# Patient Record
Sex: Male | Born: 1970 | Race: Black or African American | Hispanic: No | Smoking: Never smoker
Health system: Southern US, Community
[De-identification: ages and names within clinical notes are randomized; demographics above are authoritative.]

## PROBLEM LIST (undated history)

## (undated) DIAGNOSIS — M109 Gout, unspecified: Secondary | ICD-10-CM

## (undated) DIAGNOSIS — M1990 Unspecified osteoarthritis, unspecified site: Secondary | ICD-10-CM

## (undated) DIAGNOSIS — I10 Essential (primary) hypertension: Secondary | ICD-10-CM

## (undated) DIAGNOSIS — I639 Cerebral infarction, unspecified: Secondary | ICD-10-CM

---

## 2019-09-12 ENCOUNTER — Encounter: Payer: Self-pay | Admitting: Emergency Medicine

## 2019-09-12 ENCOUNTER — Emergency Department: Payer: Self-pay

## 2019-09-12 ENCOUNTER — Other Ambulatory Visit: Payer: Self-pay

## 2019-09-12 ENCOUNTER — Emergency Department
Admission: EM | Admit: 2019-09-12 | Discharge: 2019-09-12 | Disposition: A | Discharge status: Home / Self Care | Payer: Self-pay | Attending: Emergency Medicine | Admitting: Emergency Medicine

## 2019-09-12 DIAGNOSIS — M79604 Pain in right leg: Secondary | ICD-10-CM | POA: Insufficient documentation

## 2019-09-12 DIAGNOSIS — R2243 Localized swelling, mass and lump, lower limb, bilateral: Secondary | ICD-10-CM | POA: Insufficient documentation

## 2019-09-12 DIAGNOSIS — I10 Essential (primary) hypertension: Secondary | ICD-10-CM | POA: Insufficient documentation

## 2019-09-12 DIAGNOSIS — M069 Rheumatoid arthritis, unspecified: Secondary | ICD-10-CM | POA: Insufficient documentation

## 2019-09-12 DIAGNOSIS — R0602 Shortness of breath: Secondary | ICD-10-CM | POA: Insufficient documentation

## 2019-09-12 HISTORY — DX: Unspecified osteoarthritis, unspecified site: M19.90

## 2019-09-12 HISTORY — DX: Cerebral infarction, unspecified: I63.9

## 2019-09-12 HISTORY — DX: Essential (primary) hypertension: I10

## 2019-09-12 HISTORY — DX: Gout, unspecified: M10.9

## 2019-09-12 LAB — CBC WITH DIFFERENTIAL/PLATELET
Abs Immature Granulocytes: 0.01 10*3/uL (ref 0.00–0.07)
Basophils Absolute: 0 10*3/uL (ref 0.0–0.1)
Basophils Relative: 1 %
Eosinophils Absolute: 0.3 10*3/uL (ref 0.0–0.5)
Eosinophils Relative: 4 %
HCT: 42.1 % (ref 39.0–52.0)
Hemoglobin: 13.9 g/dL (ref 13.0–17.0)
Immature Granulocytes: 0 %
Lymphocytes Relative: 20 %
Lymphs Abs: 1.6 10*3/uL (ref 0.7–4.0)
MCH: 28.1 pg (ref 26.0–34.0)
MCHC: 33 g/dL (ref 30.0–36.0)
MCV: 85.1 fL (ref 80.0–100.0)
Monocytes Absolute: 0.9 10*3/uL (ref 0.1–1.0)
Monocytes Relative: 12 %
Neutro Abs: 5.1 10*3/uL (ref 1.7–7.7)
Neutrophils Relative %: 63 %
Platelets: 250 10*3/uL (ref 150–400)
RBC: 4.95 MIL/uL (ref 4.22–5.81)
RDW: 13.6 % (ref 11.5–15.5)
WBC: 8 10*3/uL (ref 4.0–10.5)
nRBC: 0 % (ref 0.0–0.2)

## 2019-09-12 LAB — BASIC METABOLIC PANEL WITH GFR
Anion gap: 9 (ref 5–15)
BUN: 8 mg/dL (ref 6–20)
CO2: 27 mmol/L (ref 22–32)
Calcium: 8.8 mg/dL — ABNORMAL LOW (ref 8.9–10.3)
Chloride: 102 mmol/L (ref 98–111)
Creatinine, Ser: 0.85 mg/dL (ref 0.61–1.24)
GFR calc Af Amer: >60 mL/min
GFR calc non Af Amer: >60 mL/min
Glucose, Bld: 115 mg/dL — ABNORMAL HIGH (ref 70–99)
Potassium: 3.8 mmol/L (ref 3.5–5.1)
Sodium: 138 mmol/L (ref 135–145)

## 2019-09-12 LAB — BRAIN NATRIURETIC PEPTIDE: B Natriuretic Peptide: 26 pg/mL (ref 0.0–100.0)

## 2019-09-12 MED ORDER — KETOROLAC TROMETHAMINE 30 MG/ML IJ SOLN
30.0000 mg | Freq: Once | INTRAMUSCULAR | Status: AC
  Administered 2019-09-12: 12:00:00 30 mg via INTRAMUSCULAR
  Filled 2019-09-12: qty 1

## 2019-09-12 MED ORDER — IBUPROFEN 600 MG PO TABS: 600.0000 mg | ORAL_TABLET | Freq: Four times a day (QID) | ORAL | 0 refills | Status: DC | PRN

## 2019-09-12 MED ORDER — ACETAMINOPHEN 500 MG PO TABS
1000.0000 mg | ORAL_TABLET | Freq: Once | ORAL | Status: AC
  Administered 2019-09-12: 12:00:00 1000 mg via ORAL
  Filled 2019-09-12: qty 2

## 2019-09-12 NOTE — Discharge Instructions (Signed)
You need to follow-up with rheumatology.  Please call the above number to get a follow-up appointment scheduled.  Take the ibuprofen for the next 2 weeks.  Take food with it.  Stop taking it if you develop blood in your stool or black stool.  Return to the ER for fevers, redness of the joint or any other concerns

## 2019-09-12 NOTE — ED Provider Notes (Signed)
[facility] Emergency Department Provider Note  ____________________________________________   First MD Initiated Contact with Patient 09/12/19 1152     (approximate)  I have reviewed the triage vital signs and the nursing notes.   HISTORY  Chief Complaint Joint Pain    HPI Patrick Carter is a 49 y.o. male with history of arthritis, gout, hypertension, stroke who presents with joint pain.  Patient states that he has been having joint pain all over for the past 2 months since he has moved here.  Patient states that his pain is mostly in his joints, does have some associated swelling with, does not take anything to help with the pain, worse with moving, better at rest.  On review of patient's medications he is on Leflunomide.  He does report being seen by rheumatology before moved.  Patient previously lived in [place] he states he has still been taking this medicine.  Denies any fevers, warmth of the joints.  He does report leg swelling intermittently to the point where the right leg will swell up and he will have some pain throughout the right leg.  Denies a history of blood clots.          Past Medical History:  Diagnosis Date  . Arthritis   . Gout   . Hypertension   . Stroke [facility])     There are no problems to display for this patient.   History reviewed. No pertinent surgical history.  Prior to Admission medications   Not on File    Allergies Patient has no known allergies.  No family history on file.  Social History Social History   Tobacco Use  . Smoking status: Never Smoker  . Smokeless tobacco: Never Used  Substance Use Topics  . Alcohol use: Not Currently  . Drug use: Not Currently      Review of Systems Constitutional: No fever/chills Eyes: No visual changes. ENT: No sore throat. Cardiovascular: Denies chest pain. Respiratory: Denies shortness of breath. Gastrointestinal: No abdominal pain.  No nausea, no  vomiting.  No diarrhea.  No constipation. Genitourinary: Negative for dysuria. Musculoskeletal: Negative for back pain.  Joint pain, leg swelling Skin: Negative for rash. Neurological: Negative for headaches, focal weakness or numbness. All other ROS negative ____________________________________________   PHYSICAL EXAM:  VITAL SIGNS: ED Triage Vitals  Enc Vitals Group     BP 09/12/19 1032 (!) 143/101     Pulse Rate 09/12/19 1032 85     Resp 09/12/19 1032 16     Temp 09/12/19 1032 98.8 F (37.1 C)     Temp Source 09/12/19 1032 Oral     SpO2 09/12/19 1032 100 %     Weight 09/12/19 1030 156 lb (70.8 kg)     Height 09/12/19 1030 5\' 5"  (1.651 m)     Head Circumference --      Peak Flow --      Pain Score 09/12/19 1029 10     Pain Loc --      Pain Edu? --      Excl. in GC? --     Constitutional: Alert and oriented. Well appearing and in no acute distress. Eyes: Conjunctivae are normal. EOMI. Head: Atraumatic. Nose: No congestion/rhinnorhea. Mouth/Throat: Mucous membranes are moist.   Neck: No stridor. Trachea Midline. FROM Cardiovascular: Normal rate, regular rhythm. Grossly normal heart sounds.  Good peripheral circulation. Respiratory: Normal respiratory effort.  No retractions. Lungs CTAB. Gastrointestinal: Soft and nontender. No distention. No abdominal bruits.  Musculoskeletal: Maybe a little bit of increased swelling mostly in the right knee maybe little bit in the ankle.  No warmth or redness on the joints good distal pulses Neurologic:  Normal speech and language. No gross focal neurologic deficits are appreciated.  Skin:  Skin is warm, dry and intact. No rash noted. Psychiatric: Mood and affect are normal. Speech and behavior are normal. GU: Deferred   ____________________________________________   LABS (all labs ordered are listed, but only abnormal results are displayed)  Labs Reviewed  BASIC METABOLIC PANEL - Abnormal; Notable for the following components:       Result Value   Glucose, Bld 115 (*)    Calcium 8.8 (*)    All other components within normal limits  CBC WITH DIFFERENTIAL/PLATELET  BRAIN NATRIURETIC PEPTIDE   ____________________________________________   RADIOLOGY   Official radiology report(s): US Venous Img Lower Bilateral  Result Date: 09/12/2019 CLINICAL DATA:  Bilateral leg pain and swelling for 2 weeks EXAM: BILATERAL LOWER EXTREMITY VENOUS DOPPLER ULTRASOUND TECHNIQUE: Gray-scale sonography with graded compression, as well as color Doppler and duplex ultrasound were performed to evaluate the lower extremity deep venous systems from the level of the common femoral vein and including the common femoral, femoral, profunda femoral, popliteal and calf veins including the posterior tibial, peroneal and gastrocnemius veins when visible. The superficial great saphenous vein was also interrogated. Spectral Doppler was utilized to evaluate flow at rest and with distal augmentation maneuvers in the common femoral, femoral and popliteal veins. COMPARISON:  None. FINDINGS: RIGHT LOWER EXTREMITY Common Femoral Vein: No evidence of thrombus. Normal compressibility, respiratory phasicity and response to augmentation. Saphenofemoral Junction: No evidence of thrombus. Normal compressibility and flow on color Doppler imaging. Profunda Femoral Vein: No evidence of thrombus. Normal compressibility and flow on color Doppler imaging. Femoral Vein: No evidence of thrombus. Normal compressibility, respiratory phasicity and response to augmentation. Popliteal Vein: No evidence of thrombus. Normal compressibility, respiratory phasicity and response to augmentation. Calf Veins: No evidence of thrombus. Normal compressibility and flow on color Doppler imaging. LEFT LOWER EXTREMITY Common Femoral Vein: No evidence of thrombus. Normal compressibility, respiratory phasicity and response to augmentation. Saphenofemoral Junction: No evidence of thrombus. Normal  compressibility and flow on color Doppler imaging. Profunda Femoral Vein: No evidence of thrombus. Normal compressibility and flow on color Doppler imaging. Femoral Vein: No evidence of thrombus. Normal compressibility, respiratory phasicity and response to augmentation. Popliteal Vein: No evidence of thrombus. Normal compressibility, respiratory phasicity and response to augmentation. Calf Veins: No evidence of thrombus. Normal compressibility and flow on color Doppler imaging. IMPRESSION: No evidence of deep venous thrombosis in either lower extremity. Electronically Signed   By: Judie Petit.  Shick M.D.   On: 09/12/2019 13:10    ____________________________________________   PROCEDURES  Procedure(s) performed (including Critical Care):  Procedures   ____________________________________________   INITIAL IMPRESSION / ASSESSMENT AND PLAN / ED COURSE  EADEN HETTINGER was evaluated in Emergency Department on 09/12/2019 for the symptoms described in the history of present illness. He was evaluated in the context of the global COVID-19 pandemic, which necessitated consideration that the patient might be at risk for infection with the SARS-CoV-2 virus that causes COVID-19. Institutional protocols and algorithms that pertain to the evaluation of patients at risk for COVID-19 are in a state of rapid change based on information released by regulatory bodies including the CDC and federal and state organizations. These policies and algorithms were followed during the patient's care in the ED.    Patient  comes in with pain all over but it sounds like is more likely located in his joints.  I suspect that this is secondary to his known rheumatoid arthritis.  However given patient is having some swelling in the right knee and a little in the right ankle will get ultrasound to make sure no evidence of DVT.  No evidence of septic joints on exam.  Will check basic labs to evaluate for electrolyte abnormalities, AKI,  heart failure.  No redness or warmth to suggest gout or pseudogout.  Patient noted to be hypertensive in triage.  However there is no evidence of endorgan damage given his kidney function is normal and he is not having any other symptoms. No white count to suggest infection.  No evidence of anemia  Patient feeling better after Tylenol and Toradol.  Ultrasounds are negative.  Discussed with patient that his joint pain is most likely from his rheumatoid arthritis and get started on some high-dose ibuprofen.  Will patient follow-up with rheumatology for further discussion given this is been going on for 2 months.   I discussed the provisional nature of ED diagnosis, the treatment so far, the ongoing plan of care, follow up appointments and return precautions with the patient and any family or support people present. They expressed understanding and agreed with the plan, discharged home.         ____________________________________________   FINAL CLINICAL IMPRESSION(S) / ED DIAGNOSES   Final diagnoses:  Rheumatoid arthritis, involving unspecified site, unspecified whether rheumatoid factor present ([place])      MEDICATIONS GIVEN DURING THIS VISIT:  Medications  acetaminophen (TYLENOL) tablet 1,000 mg (1,000 mg Oral Given 09/12/19 1219)  ketorolac (TORADOL) 30 MG/ML injection 30 mg (30 mg Intramuscular Given 09/12/19 1219)     ED Discharge Orders         Ordered    ibuprofen (ADVIL) 600 MG tablet  Every 6 hours PRN     09/12/19 1416           Note:  This document was prepared using Dragon voice recognition software and may include unintentional dictation errors.   Vanessa Dent, MD 09/12/19 [phone]

## 2019-09-12 NOTE — ED Notes (Signed)
US at bedside

## 2019-09-12 NOTE — ED Triage Notes (Signed)
Pt to ED via POV c/o joint pain. Pt states that he has been having pain all over since about 2 months ago when he first moved here. Pt states that he has noticed that he has swelling in his ankles at time. Pt states that ankles are currently swollen. Pt is in NAD.

## 2020-08-19 ENCOUNTER — Encounter: Payer: Self-pay | Admitting: Emergency Medicine

## 2020-08-19 ENCOUNTER — Emergency Department
Admission: EM | Admit: 2020-08-19 | Discharge: 2020-08-19 | Disposition: A | Discharge status: Home / Self Care | Payer: Self-pay | Attending: Emergency Medicine | Admitting: Emergency Medicine

## 2020-08-19 ENCOUNTER — Other Ambulatory Visit: Payer: Self-pay

## 2020-08-19 ENCOUNTER — Emergency Department: Payer: Self-pay

## 2020-08-19 DIAGNOSIS — I10 Essential (primary) hypertension: Secondary | ICD-10-CM | POA: Insufficient documentation

## 2020-08-19 DIAGNOSIS — R1011 Right upper quadrant pain: Secondary | ICD-10-CM | POA: Insufficient documentation

## 2020-08-19 DIAGNOSIS — M069 Rheumatoid arthritis, unspecified: Secondary | ICD-10-CM | POA: Insufficient documentation

## 2020-08-19 DIAGNOSIS — R1010 Upper abdominal pain, unspecified: Secondary | ICD-10-CM

## 2020-08-19 DIAGNOSIS — R1012 Left upper quadrant pain: Secondary | ICD-10-CM | POA: Insufficient documentation

## 2020-08-19 LAB — COMPREHENSIVE METABOLIC PANEL
ALT: 16 U/L (ref 0–44)
AST: 23 U/L (ref 15–41)
Albumin: 4.1 g/dL (ref 3.5–5.0)
Alkaline Phosphatase: 75 U/L (ref 38–126)
Anion gap: 9 (ref 5–15)
BUN: 13 mg/dL (ref 6–20)
CO2: 24 mmol/L (ref 22–32)
Calcium: 9 mg/dL (ref 8.9–10.3)
Chloride: 101 mmol/L (ref 98–111)
Creatinine, Ser: 0.9 mg/dL (ref 0.61–1.24)
GFR, Estimated: >60 mL/min (ref >60)
Glucose, Bld: 100 mg/dL — ABNORMAL HIGH (ref 70–99)
Potassium: 4.2 mmol/L (ref 3.5–5.1)
Sodium: 134 mmol/L — ABNORMAL LOW (ref 135–145)
Total Bilirubin: 0.8 mg/dL (ref 0.3–1.2)
Total Protein: 7.7 g/dL (ref 6.5–8.1)

## 2020-08-19 LAB — CBC
HCT: 43 % (ref 39.0–52.0)
Hemoglobin: 14.1 g/dL (ref 13.0–17.0)
MCH: 27.6 pg (ref 26.0–34.0)
MCHC: 32.8 g/dL (ref 30.0–36.0)
MCV: 84.1 fL (ref 80.0–100.0)
Platelets: 239 10*3/uL (ref 150–400)
RBC: 5.11 MIL/uL (ref 4.22–5.81)
RDW: 13.4 % (ref 11.5–15.5)
WBC: 8.1 10*3/uL (ref 4.0–10.5)
nRBC: 0 % (ref 0.0–0.2)

## 2020-08-19 LAB — URINALYSIS, COMPLETE (UACMP) WITH MICROSCOPIC
Bacteria, UA: NONE SEEN
Bilirubin Urine: NEGATIVE
Glucose, UA: NEGATIVE mg/dL
Hgb urine dipstick: NEGATIVE
Ketones, ur: NEGATIVE mg/dL
Leukocytes,Ua: NEGATIVE
Nitrite: NEGATIVE
Protein, ur: NEGATIVE mg/dL
Specific Gravity, Urine: 1.003 — ABNORMAL LOW (ref 1.005–1.030)
Squamous Epithelial / HPF: NONE SEEN (ref 0–5)
pH: 6 (ref 5.0–8.0)

## 2020-08-19 LAB — LIPASE, BLOOD: Lipase: 39 U/L (ref 11–51)

## 2020-08-19 MED ORDER — IOHEXOL 300 MG/ML  SOLN
100.0000 mL | Freq: Once | INTRAMUSCULAR | Status: AC | PRN
  Administered 2020-08-19: 100 mL via INTRAVENOUS
  Filled 2020-08-19: qty 100

## 2020-08-19 MED ORDER — KETOROLAC TROMETHAMINE 30 MG/ML IJ SOLN
30.0000 mg | Freq: Once | INTRAMUSCULAR | Status: AC
  Administered 2020-08-19: 30 mg via INTRAVENOUS
  Filled 2020-08-19: qty 1

## 2020-08-19 MED ORDER — ONDANSETRON 4 MG PO TBDP: 4.0000 mg | ORAL_TABLET | Freq: Three times a day (TID) | ORAL | 0 refills | Status: DC | PRN

## 2020-08-19 MED ORDER — SODIUM CHLORIDE 0.9 % IV BOLUS
1000.0000 mL | Freq: Once | INTRAVENOUS | Status: AC
  Administered 2020-08-19: 1000 mL via INTRAVENOUS

## 2020-08-19 MED ORDER — KETOROLAC TROMETHAMINE 10 MG PO TABS: 10.0000 mg | ORAL_TABLET | Freq: Four times a day (QID) | ORAL | 0 refills | Status: DC | PRN

## 2020-08-19 NOTE — ED Triage Notes (Signed)
Pt to ED via POV for generalized abdominal pain and nausea since waking up this morning. Pt denies vomiting. Diarrhea, or fever. Pt states that he has had some chills. Pt is in NAD.

## 2020-08-19 NOTE — ED Notes (Signed)
See triage note  Presents with body aches ,nausea and chills   States sxs' started about 1 month ago  But feels like body aches are worse today  Unsure of fever  But has had chills at home  Afebrile on arrival

## 2020-08-19 NOTE — ED Provider Notes (Signed)
[facility] Emergency Department Provider Note  ____________________________________________   Event Date/Time   First MD Initiated Contact with Patient 08/19/20 1045     (approximate)  I have reviewed the triage vital signs and the nursing notes.   HISTORY  Chief Complaint Abdominal Pain and Nausea    HPI Patrick Carter is a 50 y.o. male [facility] emergency department with diffuse abdominal pain.  Patient states he has body aches regularly due to rheumatoid arthritis.  States that the abdominal pain started few days ago and now he feels nauseated every time he tries to eat.  No actual vomiting or diarrhea.  Just abdominal pain.  No fever or chills.  No recent antibiotics, camping, bad food etc.    Past Medical History:  Diagnosis Date  . Arthritis   . Gout   . Hypertension   . Stroke [facility])     There are no problems to display for this patient.   History reviewed. No pertinent surgical history.  Prior to Admission medications   Medication Sig Start Date End Date Taking? Authorizing Provider  ketorolac (TORADOL) 10 MG tablet Take 1 tablet (10 mg total) by mouth every 6 (six) hours as needed. 08/19/20  Yes Fisher, Roselyn Bering, PA-C  ondansetron (ZOFRAN-ODT) 4 MG disintegrating tablet Take 1 tablet (4 mg total) by mouth every 8 (eight) hours as needed. 08/19/20  Yes Faythe Ghee, PA-C    Allergies Patient has no known allergies.  No family history on file.  Social History Social History   Tobacco Use  . Smoking status: Never Smoker  . Smokeless tobacco: Never Used  Substance Use Topics  . Alcohol use: Not Currently  . Drug use: Not Currently    Review of Systems  Constitutional: No fever/chills Eyes: No visual changes. ENT: No sore throat. Respiratory: Denies cough Cardiovascular: Denies chest pain Gastrointestinal: Positive abdominal pain Genitourinary: Negative for dysuria. Musculoskeletal: Negative for back pain. Skin: Negative  for rash. Psychiatric: no mood changes,     ____________________________________________   PHYSICAL EXAM:  VITAL SIGNS: ED Triage Vitals  Enc Vitals Group     BP 08/19/20 1030 (!) 159/114     Pulse Rate 08/19/20 1030 77     Resp 08/19/20 1030 15     Temp 08/19/20 1030 98.4 F (36.9 C)     Temp Source 08/19/20 1030 Oral     SpO2 08/19/20 1030 96 %     Weight 08/19/20 1030 160 lb (72.6 kg)     Height 08/19/20 1030 5\' 5"  (1.651 m)     Head Circumference --      Peak Flow --      Pain Score 08/19/20 1033 8     Pain Loc --      Pain Edu? --      Excl. in GC? --     Constitutional: Alert and oriented. Well appearing and in no acute distress. Eyes: Conjunctivae are normal.  Head: Atraumatic. Nose: No congestion/rhinnorhea. Mouth/Throat: Mucous membranes are moist.   Neck:  supple no lymphadenopathy noted Cardiovascular: Normal rate, regular rhythm. Heart sounds are normal Respiratory: Normal respiratory effort.  No retractions, lungs c t a  Abd: soft tender right upper quadrant, left upper quadrant, and left lower quadrant, right lower quadrant is minimally tender , bs normal all 4 quad GU: deferred Musculoskeletal: FROM all extremities, warm and well perfused Neurologic:  Normal speech and language.  Skin:  Skin is warm, dry and intact. No rash noted.  Psychiatric: Mood and affect are normal. Speech and behavior are normal.  ____________________________________________   LABS (all labs ordered are listed, but only abnormal results are displayed)  Labs Reviewed  COMPREHENSIVE METABOLIC PANEL - Abnormal; Notable for the following components:      Result Value   Sodium 134 (*)    Glucose, Bld 100 (*)    All other components within normal limits  URINALYSIS, COMPLETE (UACMP) WITH MICROSCOPIC - Abnormal; Notable for the following components:   Color, Urine COLORLESS (*)    APPearance CLEAR (*)    Specific Gravity, Urine 1.003 (*)    All other components within normal  limits  LIPASE, BLOOD  CBC   ____________________________________________   ____________________________________________  RADIOLOGY  Ct abdomen/pelvis  ____________________________________________   PROCEDURES  Procedure(s) performed: No  Procedures    ____________________________________________   INITIAL IMPRESSION / ASSESSMENT AND PLAN / ED COURSE  Pertinent labs & imaging results that were available during my care of the patient were reviewed by me and considered in my medical decision making (see chart for details).   Patient is 50 year old male presents with arm pain.  See HPI.  Physical exam shows patient appears stable.  DDx: COVID/influenza/rheumatoid flare/acute cholecystitis, gastroenteritis, acute appendicitis, acute diverticulitis   CBC, comprehensive metabolic panel and lipase are all normal  CT abdomen/pelvis is normal.  I did explain all the findings to the patient.  I feel he is having more of a arthritis flare.  Do not feel he has covid or the flu as he does not have typical symptoms.  He was given Toradol while here in the ED which helped greatly with his pain.  We will give him a prescription for Toradol.  Prescription for Zofran as needed.  He was discharged stable condition.  Patrick Carter was evaluated in Emergency Department on 08/19/2020 for the symptoms described in the history of present illness. He was evaluated in the context of the global COVID-19 pandemic, which necessitated consideration that the patient might be at risk for infection with the SARS-CoV-2 virus that causes COVID-19. Institutional protocols and algorithms that pertain to the evaluation of patients at risk for COVID-19 are in a state of rapid change based on information released by regulatory bodies including the CDC and federal and state organizations. These policies and algorithms were followed during the patient's care in the ED.    As part of my medical decision making,  I reviewed the following data within the electronic MEDICAL RECORD NUMBER Nursing notes reviewed and incorporated, Labs reviewed , Old chart reviewed, Radiograph reviewed , Notes from prior ED visits and [place] Controlled Substance Database  ____________________________________________   FINAL CLINICAL IMPRESSION(S) / ED DIAGNOSES  Final diagnoses:  Pain of upper abdomen  Rheumatoid arthritis flare (HCC)      NEW MEDICATIONS STARTED DURING THIS VISIT:  New Prescriptions   KETOROLAC (TORADOL) 10 MG TABLET    Take 1 tablet (10 mg total) by mouth every 6 (six) hours as needed.   ONDANSETRON (ZOFRAN-ODT) 4 MG DISINTEGRATING TABLET    Take 1 tablet (4 mg total) by mouth every 8 (eight) hours as needed.     Note:  This document was prepared using Dragon voice recognition software and may include unintentional dictation errors.    Faythe Ghee, PA-C 08/19/20 1222    Gilles Chiquito, MD 08/19/20 [phone]

## 2020-12-20 ENCOUNTER — Emergency Department: Payer: Self-pay

## 2020-12-20 ENCOUNTER — Emergency Department
Admission: EM | Admit: 2020-12-20 | Discharge: 2020-12-20 | Disposition: A | Discharge status: Home / Self Care | Payer: Self-pay | Attending: Emergency Medicine | Admitting: Emergency Medicine

## 2020-12-20 ENCOUNTER — Other Ambulatory Visit: Payer: Self-pay

## 2020-12-20 DIAGNOSIS — R079 Chest pain, unspecified: Secondary | ICD-10-CM | POA: Insufficient documentation

## 2020-12-20 DIAGNOSIS — B349 Viral infection, unspecified: Secondary | ICD-10-CM

## 2020-12-20 DIAGNOSIS — R059 Cough, unspecified: Secondary | ICD-10-CM | POA: Insufficient documentation

## 2020-12-20 DIAGNOSIS — I10 Essential (primary) hypertension: Secondary | ICD-10-CM | POA: Insufficient documentation

## 2020-12-20 DIAGNOSIS — R5383 Other fatigue: Secondary | ICD-10-CM | POA: Insufficient documentation

## 2020-12-20 DIAGNOSIS — R110 Nausea: Secondary | ICD-10-CM | POA: Insufficient documentation

## 2020-12-20 DIAGNOSIS — Z20822 Contact with and (suspected) exposure to covid-19: Secondary | ICD-10-CM | POA: Insufficient documentation

## 2020-12-20 DIAGNOSIS — R5381 Other malaise: Secondary | ICD-10-CM | POA: Insufficient documentation

## 2020-12-20 DIAGNOSIS — R0602 Shortness of breath: Secondary | ICD-10-CM | POA: Insufficient documentation

## 2020-12-20 LAB — TROPONIN I (HIGH SENSITIVITY)
Troponin I (High Sensitivity): 2 ng/L (ref <18)
Troponin I (High Sensitivity): 3 ng/L (ref <18)

## 2020-12-20 LAB — CBC
HCT: 43.7 % (ref 39.0–52.0)
Hemoglobin: 14.2 g/dL (ref 13.0–17.0)
MCH: 27.8 pg (ref 26.0–34.0)
MCHC: 32.5 g/dL (ref 30.0–36.0)
MCV: 85.7 fL (ref 80.0–100.0)
Platelets: 232 10*3/uL (ref 150–400)
RBC: 5.1 MIL/uL (ref 4.22–5.81)
RDW: 13.7 % (ref 11.5–15.5)
WBC: 8.2 10*3/uL (ref 4.0–10.5)
nRBC: 0 % (ref 0.0–0.2)

## 2020-12-20 LAB — BASIC METABOLIC PANEL
Anion gap: 7 (ref 5–15)
BUN: 11 mg/dL (ref 6–20)
CO2: 25 mmol/L (ref 22–32)
Calcium: 8.9 mg/dL (ref 8.9–10.3)
Chloride: 104 mmol/L (ref 98–111)
Creatinine, Ser: 0.89 mg/dL (ref 0.61–1.24)
GFR, Estimated: >60 mL/min (ref >60)
Glucose, Bld: 139 mg/dL — ABNORMAL HIGH (ref 70–99)
Potassium: 4.3 mmol/L (ref 3.5–5.1)
Sodium: 136 mmol/L (ref 135–145)

## 2020-12-20 LAB — RESP PANEL BY RT-PCR (FLU A&B, COVID) ARPGX2
Influenza A by PCR: NEGATIVE
Influenza B by PCR: NEGATIVE
SARS Coronavirus 2 by RT PCR: NEGATIVE

## 2020-12-20 MED ORDER — ONDANSETRON 4 MG PO TBDP: 4.0000 mg | ORAL_TABLET | Freq: Three times a day (TID) | ORAL | 0 refills | Status: AC | PRN

## 2020-12-20 MED ORDER — ONDANSETRON HCL 4 MG/2ML IJ SOLN
4.0000 mg | Freq: Once | INTRAMUSCULAR | Status: AC
  Administered 2020-12-20: 4 mg via INTRAVENOUS
  Filled 2020-12-20: qty 2

## 2020-12-20 MED ORDER — LACTATED RINGERS IV BOLUS
1000.0000 mL | Freq: Once | INTRAVENOUS | Status: AC
  Administered 2020-12-20: 1000 mL via INTRAVENOUS

## 2020-12-20 NOTE — ED Provider Notes (Signed)
[facility] Emergency Department Provider Note   ____________________________________________   Event Date/Time   First MD Initiated Contact with Patient 12/20/20 1805     (approximate)  I have reviewed the triage vital signs and the nursing notes.   HISTORY  Chief Complaint Chest Pain    HPI Patrick Carter is a 50 y.o. male with past medical history of hypertension, stroke, and gout who presents to the ED complaining of chest pain.  Patient reports that he has been feeling malaised and fatigued for the past 4 days.  This is been associated with nausea but he has not vomited and denies any abdominal pain or changes in his bowel movements.  He does report developing a nonproductive cough 2 days ago with some mild shortness of breath and discomfort in his chest starting earlier today.  He describes the chest pain as a pressure, present constantly over the course of the day today.  He has felt feverish with chills, but has not taken his temperature.  He does state that he was exposed to someone who was sick at the end of last week.  He has received 2 doses of the COVID-19 vaccine.        Past Medical History:  Diagnosis Date   Arthritis    Gout    Hypertension    Stroke (HCC)     There are no problems to display for this patient.   History reviewed. No pertinent surgical history.  Prior to Admission medications   Medication Sig Start Date End Date Taking? Authorizing Provider  ondansetron (ZOFRAN ODT) 4 MG disintegrating tablet Take 1 tablet (4 mg total) by mouth every 8 (eight) hours as needed for nausea or vomiting. 12/20/20  Yes Chesley Noon, MD  ketorolac (TORADOL) 10 MG tablet Take 1 tablet (10 mg total) by mouth every 6 (six) hours as needed. 08/19/20   Faythe Ghee, PA-C    Allergies Patient has no known allergies.  No family history on file.  Social History Social History   Tobacco Use   Smoking status: Never   Smokeless  tobacco: Never  Substance Use Topics   Alcohol use: Not Currently   Drug use: Not Currently    Review of Systems  Constitutional: Positive for subjective fever and chills, positive for generalized weakness and malaise. Eyes: No visual changes. ENT: No sore throat. Cardiovascular: Positive for chest pain. Respiratory: Positive for cough and shortness of breath. Gastrointestinal: No abdominal pain.  Positive for nausea, no vomiting.  No diarrhea.  No constipation. Genitourinary: Negative for dysuria. Musculoskeletal: Negative for back pain. Skin: Negative for rash. Neurological: Negative for headaches, focal weakness or numbness.  ____________________________________________   PHYSICAL EXAM:  VITAL SIGNS: ED Triage Vitals  Enc Vitals Group     BP 12/20/20 1701 (!) 148/111     Pulse Rate 12/20/20 1701 88     Resp 12/20/20 1701 18     Temp 12/20/20 1701 98 F (36.7 C)     Temp Source 12/20/20 1701 Oral     SpO2 12/20/20 1701 99 %     Weight 12/20/20 1702 162 lb (73.5 kg)     Height 12/20/20 1702 5\' 5"  (1.651 m)     Head Circumference --      Peak Flow --      Pain Score 12/20/20 1701 8     Pain Loc --      Pain Edu? --      Excl. in GC? --  Constitutional: Alert and oriented. Eyes: Conjunctivae are normal. Head: Atraumatic. Nose: No congestion/rhinnorhea. Mouth/Throat: Mucous membranes are moist. Neck: Normal ROM Cardiovascular: Normal rate, regular rhythm. Grossly normal heart sounds.  2+ radial pulses bilaterally. Respiratory: Normal respiratory effort.  No retractions. Lungs CTAB. Gastrointestinal: Soft and nontender. No distention. Genitourinary: deferred Musculoskeletal: No lower extremity tenderness nor edema. Neurologic:  Normal speech and language. No gross focal neurologic deficits are appreciated. Skin:  Skin is warm, dry and intact. No rash noted. Psychiatric: Mood and affect are normal. Speech and behavior are  normal.  ____________________________________________   LABS (all labs ordered are listed, but only abnormal results are displayed)  Labs Reviewed  BASIC METABOLIC PANEL - Abnormal; Notable for the following components:      Result Value   Glucose, Bld 139 (*)    All other components within normal limits  RESP PANEL BY RT-PCR (FLU A&B, COVID) ARPGX2  CBC  TROPONIN I (HIGH SENSITIVITY)  TROPONIN I (HIGH SENSITIVITY)   ____________________________________________  EKG  ED ECG REPORT I, Chesley Noon, the attending physician, personally viewed and interpreted this ECG.   Date: 12/20/2020  EKG Time: 17:06  Rate: 92  Rhythm: normal sinus rhythm  Axis: Normal  Intervals:none  ST&T Change: None   PROCEDURES  Procedure(s) performed (including Critical Care):  Procedures   ____________________________________________   INITIAL IMPRESSION / ASSESSMENT AND PLAN / ED COURSE      50 year old male with past medical history of hypertension, stroke, and gout who presents to the ED complaining of weakness and malaise starting 5 days ago that has progressively worsened and become associated with cough, mild shortness of breath, and pressure in his chest.  Patient is not in any respiratory distress and is maintaining O2 sats on room air.  EKG shows no evidence of arrhythmia or ischemia and I have low suspicion for ACS, initial troponin is negative.  Additional labs are unremarkable and I doubt PE, symptoms most consistent with viral syndrome.  We will perform testing for COVID-19 and influenza, treat nausea with IV Zofran, and hydrate with IV fluids.  Patient reports feeling better following Zofran and IV fluids, repeat troponin is within normal limits.  COVID-19 results are pending but patient is appropriate for discharge home at this time given reassuring work-up.  He was counseled to follow-up with his PCP and otherwise return to the ED for new or worsening symptoms.  Patient agrees  with plan.      ____________________________________________   FINAL CLINICAL IMPRESSION(S) / ED DIAGNOSES  Final diagnoses:  Chest pain, unspecified type  Viral syndrome  Nausea     ED Discharge Orders          Ordered    ondansetron (ZOFRAN ODT) 4 MG disintegrating tablet  Every 8 hours PRN        12/20/20 2003             Note:  This document was prepared using Dragon voice recognition software and may include unintentional dictation errors.    Chesley Noon, MD 12/20/20 2004

## 2020-12-20 NOTE — ED Triage Notes (Signed)
Pt c/o chest pain with feeling like his heart is skipping beats , SOB nausea since Saturday. Pt is ambulatory to triage with no distress noted at present

## 2021-02-28 ENCOUNTER — Emergency Department
Admission: EM | Admit: 2021-02-28 | Discharge: 2021-02-28 | Disposition: A | Discharge status: Home / Self Care | Payer: Self-pay | Attending: Emergency Medicine | Admitting: Emergency Medicine

## 2021-02-28 ENCOUNTER — Other Ambulatory Visit: Payer: Self-pay

## 2021-02-28 DIAGNOSIS — M069 Rheumatoid arthritis, unspecified: Secondary | ICD-10-CM

## 2021-02-28 DIAGNOSIS — M06822 Other specified rheumatoid arthritis, left elbow: Secondary | ICD-10-CM | POA: Insufficient documentation

## 2021-02-28 DIAGNOSIS — I10 Essential (primary) hypertension: Secondary | ICD-10-CM | POA: Insufficient documentation

## 2021-02-28 DIAGNOSIS — M06862 Other specified rheumatoid arthritis, left knee: Secondary | ICD-10-CM | POA: Insufficient documentation

## 2021-02-28 DIAGNOSIS — M06821 Other specified rheumatoid arthritis, right elbow: Secondary | ICD-10-CM | POA: Insufficient documentation

## 2021-02-28 DIAGNOSIS — M06861 Other specified rheumatoid arthritis, right knee: Secondary | ICD-10-CM | POA: Insufficient documentation

## 2021-02-28 DIAGNOSIS — M791 Myalgia, unspecified site: Secondary | ICD-10-CM | POA: Insufficient documentation

## 2021-02-28 MED ORDER — PREDNISONE 50 MG PO TABS: 50.0000 mg | ORAL_TABLET | Freq: Every day | ORAL | 0 refills | Status: DC

## 2021-02-28 NOTE — ED Notes (Signed)
Says body aches, chills for 2 weeks now.  No respiratory issues or cough. No urinary issues.    history of arthritis and it does hurt in his joints.  He is in no distress.

## 2021-02-28 NOTE — ED Triage Notes (Signed)
Pt comes with c/o body aches and chills since last week.

## 2021-02-28 NOTE — ED Triage Notes (Signed)
First Nurse NOte:  C/O upper body pain x 1 week.  AAOx3.  Skin warm and dry. NAD

## 2021-03-01 NOTE — ED Provider Notes (Signed)
[facility] Emergency Department Provider Note   ____________________________________________    I have reviewed the triage vital signs and the nursing notes.   HISTORY  Chief Complaint Generalized Body Aches     HPI Patrick Carter is a 50 y.o. male who presents with complaints of joint pain.  Patient reports history of arthritis.  Notes that hands in particular and wrists have been aching more than usual.  Also has discomfort in his knees and elbows.  Has never seen a rheumatologist.  Denies fevers or chills.  Has not take anything for this.  No erythema or rash  Past Medical History:  Diagnosis Date   Arthritis    Gout    Hypertension    Stroke (HCC)     There are no problems to display for this patient.   History reviewed. No pertinent surgical history.  Prior to Admission medications   Medication Sig Start Date End Date Taking? Authorizing Provider  predniSONE (DELTASONE) 50 MG tablet Take 1 tablet (50 mg total) by mouth daily with breakfast. 02/28/21  Yes Jene Every, MD  ketorolac (TORADOL) 10 MG tablet Take 1 tablet (10 mg total) by mouth every 6 (six) hours as needed. 08/19/20   Sherrie Mustache, Roselyn Bering, PA-C  ondansetron (ZOFRAN ODT) 4 MG disintegrating tablet Take 1 tablet (4 mg total) by mouth every 8 (eight) hours as needed for nausea or vomiting. 12/20/20   Chesley Noon, MD     Allergies Patient has no known allergies.  No family history on file.  Social History Social History   Tobacco Use   Smoking status: Never   Smokeless tobacco: Never  Substance Use Topics   Alcohol use: Not Currently   Drug use: Not Currently    Review of Systems  Constitutional: No fever/chills       Musculoskeletal: As above Skin: Negative for rash. Neurological: Negative for headaches     ____________________________________________   PHYSICAL EXAM:  VITAL SIGNS: ED Triage Vitals  Enc Vitals Group     BP 02/28/21 0954 (!)  147/112     Pulse Rate 02/28/21 0954 72     Resp 02/28/21 0954 19     Temp 02/28/21 0954 98.3 F (36.8 C)     Temp Source 02/28/21 0954 Oral     SpO2 02/28/21 0954 96 %     Weight --      Height --      Head Circumference --      Peak Flow --      Pain Score 02/28/21 0956 9     Pain Loc --      Pain Edu? --      Excl. in GC? --      Constitutional: Alert and oriented.  Eyes: Conjunctivae are normal.  Head: Atraumatic. Nose: No congestion/rhinnorhea. Mouth/Throat: Mucous membranes are moist.   Cardiovascular: Normal rate, regular rhythm.  Respiratory: Normal respiratory effort.  No retractions.  Musculoskeletal: Minimal joint swelling noted, ulnar deviation noted in the hands suspicious rheumatoid arthritis, no evidence of septic joint Neurologic:  Normal speech and language. No gross focal neurologic deficits are appreciated.   Skin:  Skin is warm, dry and intact.   ____________________________________________   LABS (all labs ordered are listed, but only abnormal results are displayed)  Labs Reviewed - No data to display ____________________________________________  EKG   ____________________________________________  RADIOLOGY   ____________________________________________   PROCEDURES  Procedure(s) performed: No  Procedures   Critical Care performed: No  ____________________________________________   INITIAL IMPRESSION / ASSESSMENT AND PLAN / ED COURSE  Pertinent labs & imaging results that were available during my care of the patient were reviewed by me and considered in my medical decision making (see chart for details).   Question undiagnosed rheumatoid arthritis given ulnar deviation noted, patient well-appearing vitals unremarkable, will start the patient on a course of prednisone for symptom relief,  referral to rheumatology   ____________________________________________   FINAL CLINICAL IMPRESSION(S) / ED DIAGNOSES  Final diagnoses:   Rheumatoid arthritis involving multiple sites, unspecified whether rheumatoid factor present (HCC)      NEW MEDICATIONS STARTED DURING THIS VISIT:  Discharge Medication List as of 02/28/2021 12:00 PM     START taking these medications   Details  predniSONE (DELTASONE) 50 MG tablet Take 1 tablet (50 mg total) by mouth daily with breakfast., Starting Tue 02/28/2021, Normal         Note:  This document was prepared using Dragon voice recognition software and may include unintentional dictation errors.    Jene Every, MD 03/01/21 [phone]

## 2021-05-20 ENCOUNTER — Other Ambulatory Visit: Payer: Self-pay

## 2021-05-20 ENCOUNTER — Emergency Department
Admission: EM | Admit: 2021-05-20 | Discharge: 2021-05-20 | Disposition: A | Discharge status: Home / Self Care | Payer: Self-pay | Attending: Emergency Medicine | Admitting: Emergency Medicine

## 2021-05-20 DIAGNOSIS — E1165 Type 2 diabetes mellitus with hyperglycemia: Secondary | ICD-10-CM | POA: Insufficient documentation

## 2021-05-20 DIAGNOSIS — R739 Hyperglycemia, unspecified: Secondary | ICD-10-CM

## 2021-05-20 DIAGNOSIS — I10 Essential (primary) hypertension: Secondary | ICD-10-CM | POA: Insufficient documentation

## 2021-05-20 DIAGNOSIS — Z7984 Long term (current) use of oral hypoglycemic drugs: Secondary | ICD-10-CM | POA: Insufficient documentation

## 2021-05-20 LAB — URINALYSIS, ROUTINE W REFLEX MICROSCOPIC
Bacteria, UA: NONE SEEN
Bilirubin Urine: NEGATIVE
Glucose, UA: >=500 mg/dL — AB
Hgb urine dipstick: NEGATIVE
Ketones, ur: 20 mg/dL — AB
Leukocytes,Ua: NEGATIVE
Nitrite: NEGATIVE
Protein, ur: NEGATIVE mg/dL
Specific Gravity, Urine: 1.035 — ABNORMAL HIGH (ref 1.005–1.030)
Squamous Epithelial / HPF: NONE SEEN (ref 0–5)
pH: 5 (ref 5.0–8.0)

## 2021-05-20 LAB — CBC
HCT: 41.1 % (ref 39.0–52.0)
Hemoglobin: 13.4 g/dL (ref 13.0–17.0)
MCH: 27.3 pg (ref 26.0–34.0)
MCHC: 32.6 g/dL (ref 30.0–36.0)
MCV: 83.9 fL (ref 80.0–100.0)
Platelets: 205 10*3/uL (ref 150–400)
RBC: 4.9 MIL/uL (ref 4.22–5.81)
RDW: 13.6 % (ref 11.5–15.5)
WBC: 7.6 10*3/uL (ref 4.0–10.5)
nRBC: 0 % (ref 0.0–0.2)

## 2021-05-20 LAB — BASIC METABOLIC PANEL
Anion gap: 9 (ref 5–15)
BUN: 14 mg/dL (ref 6–20)
CO2: 25 mmol/L (ref 22–32)
Calcium: 9.2 mg/dL (ref 8.9–10.3)
Chloride: 96 mmol/L — ABNORMAL LOW (ref 98–111)
Creatinine, Ser: 0.9 mg/dL (ref 0.61–1.24)
GFR, Estimated: >60 mL/min (ref >60)
Glucose, Bld: 412 mg/dL — ABNORMAL HIGH (ref 70–99)
Potassium: 4 mmol/L (ref 3.5–5.1)
Sodium: 130 mmol/L — ABNORMAL LOW (ref 135–145)

## 2021-05-20 LAB — CBG MONITORING, ED
Glucose-Capillary: 424 mg/dL — ABNORMAL HIGH (ref 70–99)
Glucose-Capillary: 143 mg/dL — ABNORMAL HIGH (ref 70–99)

## 2021-05-20 MED ORDER — INSULIN ASPART 100 UNIT/ML IJ SOLN
5.0000 [IU] | Freq: Once | INTRAMUSCULAR | Status: AC
  Administered 2021-05-20: 5 [IU] via INTRAVENOUS
  Filled 2021-05-20: qty 1

## 2021-05-20 MED ORDER — METFORMIN HCL 500 MG PO TABS
500.0000 mg | ORAL_TABLET | Freq: Once | ORAL | Status: AC
  Administered 2021-05-20: 500 mg via ORAL
  Filled 2021-05-20: qty 1

## 2021-05-20 MED ORDER — SODIUM CHLORIDE 0.9 % IV BOLUS
1000.0000 mL | Freq: Once | INTRAVENOUS | Status: AC
  Administered 2021-05-20: 1000 mL via INTRAVENOUS

## 2021-05-20 MED ORDER — METFORMIN HCL 500 MG PO TABS: 500.0000 mg | ORAL_TABLET | Freq: Every day | ORAL | 11 refills | Status: DC

## 2021-05-20 NOTE — ED Triage Notes (Signed)
Pt states that he has not been feeling well that past couple weeks so he decided to check his blood sugar and it was 556- pt states he has also been peeing more than usual

## 2021-05-20 NOTE — ED Provider Notes (Signed)
[facility] Emergency Department Provider Note   ____________________________________________   Event Date/Time   First MD Initiated Contact with Patient 05/20/21 1734     (approximate)  I have reviewed the triage vital signs and the nursing notes.   HISTORY  Chief Complaint Hyperglycemia    HPI Patrick Carter is a 50 y.o. male who presents for hyperglycemia  LOCATION: Generalized DURATION: 3 weeks prior to arrival TIMING: Stable since onset SEVERITY: Moderate QUALITY: Hyperglycemia CONTEXT: Patient states that he has been feeling increasing malaise, polydipsia, and polyuria over the last 3 weeks and when he checked his blood sugar today, he was found to be 556.  Patient denies any history of diabetes diagnosis. MODIFYING FACTORS: Denies any exacerbating or relieving factors ASSOCIATED SYMPTOMS: Polyuria, polydipsia, generalized malaise   Per medical record review, patient has history of hypertension and gout          Past Medical History:  Diagnosis Date   Arthritis    Gout    Hypertension    Stroke (HCC)     There are no problems to display for this patient.   History reviewed. No pertinent surgical history.  Prior to Admission medications   Medication Sig Start Date End Date Taking? Authorizing Provider  metFORMIN (GLUCOPHAGE) 500 MG tablet Take 1 tablet (500 mg total) by mouth daily with breakfast. 05/20/21 05/20/22 Yes Aleathia Purdy, Clent Jacks, MD  ketorolac (TORADOL) 10 MG tablet Take 1 tablet (10 mg total) by mouth every 6 (six) hours as needed. 08/19/20   Sherrie Mustache, Roselyn Bering, PA-C  ondansetron (ZOFRAN ODT) 4 MG disintegrating tablet Take 1 tablet (4 mg total) by mouth every 8 (eight) hours as needed for nausea or vomiting. 12/20/20   Chesley Noon, MD  predniSONE (DELTASONE) 50 MG tablet Take 1 tablet (50 mg total) by mouth daily with breakfast. 02/28/21   Jene Every, MD    Allergies Patient has no known allergies.  No family  history on file.  Social History Social History   Tobacco Use   Smoking status: Never   Smokeless tobacco: Never  Substance Use Topics   Alcohol use: Not Currently   Drug use: Not Currently    Review of Systems Constitutional: No fever/chills Eyes: No visual changes. ENT: No sore throat. Cardiovascular: Denies chest pain. Respiratory: Denies shortness of breath. Gastrointestinal: No abdominal pain. No nausea, no vomiting.  No diarrhea. Endocrine: Polydipsia  Genitourinary: Negative for dysuria.  Polyuria Musculoskeletal: Negative for acute arthralgias Skin: Negative for rash. Neurological: Negative for headaches, weakness/numbness/paresthesias in any extremity Psychiatric: Negative for suicidal ideation/homicidal ideation   ____________________________________________   PHYSICAL EXAM:  VITAL SIGNS: ED Triage Vitals  Enc Vitals Group     BP 05/20/21 1608 (!) 138/110     Pulse Rate 05/20/21 1608 86     Resp 05/20/21 1608 18     Temp 05/20/21 1608 98.4 F (36.9 C)     Temp Source 05/20/21 1608 Oral     SpO2 05/20/21 1608 96 %     Weight 05/20/21 1609 156 lb (70.8 kg)     Height 05/20/21 1609 5\' 5"  (1.651 m)     Head Circumference --      Peak Flow --      Pain Score 05/20/21 1609 4     Pain Loc --      Pain Edu? --      Excl. in GC? --    Constitutional: Alert and oriented. Well appearing and in no acute  distress. Eyes: Conjunctivae are injected. PERRL. Head: Atraumatic. Nose: No congestion/rhinnorhea. Mouth/Throat: Mucous membranes are moist. Neck: No stridor Cardiovascular: Grossly normal heart sounds.  Good peripheral circulation. Respiratory: Normal respiratory effort.  No retractions. Gastrointestinal: Soft and nontender. No distention. Musculoskeletal: No obvious deformities Neurologic:  Normal speech and language. No gross focal neurologic deficits are appreciated. Skin:  Skin is warm and dry. No rash noted. Psychiatric: Mood and affect are normal.  Speech and behavior are normal.  ____________________________________________   LABS (all labs ordered are listed, but only abnormal results are displayed)  Labs Reviewed  BASIC METABOLIC PANEL - Abnormal; Notable for the following components:      Result Value   Sodium 130 (*)    Chloride 96 (*)    Glucose, Bld 412 (*)    All other components within normal limits  URINALYSIS, ROUTINE W REFLEX MICROSCOPIC - Abnormal; Notable for the following components:   Color, Urine STRAW (*)    APPearance CLEAR (*)    Specific Gravity, Urine 1.035 (*)    Glucose, UA >=500 (*)    Ketones, ur 20 (*)    All other components within normal limits  CBG MONITORING, ED - Abnormal; Notable for the following components:   Glucose-Capillary 424 (*)    All other components within normal limits  CBC  CBG MONITORING, ED   PROCEDURES  Procedure(s) performed (including Critical Care):  .1-3 Lead EKG Interpretation Performed by: Merwyn Katos, MD Authorized by: Merwyn Katos, MD     Interpretation: normal     ECG rate:  82   ECG rate assessment: normal     Rhythm: sinus rhythm     Ectopy: none     Conduction: normal     ____________________________________________   INITIAL IMPRESSION / ASSESSMENT AND PLAN / ED COURSE  As part of my medical decision making, I reviewed the following data within the electronic medical record, if available:  Nursing notes reviewed and incorporated, Labs reviewed, EKG interpreted, Old chart reviewed, Radiograph reviewed and Notes from prior ED visits reviewed and incorporated        Patients presentation most consistent with hyperglycemic state WITHOUT evidence of DKA. Given Exam, History, and Workup I have low suspicion for an emergent precipitating factor of this hyperglycemic state such as atypical MI, acute abdomen, or other serious bacterial illness. Patient is Type 2 Diabetic with changes in medication regimen/adherence.  Findings: Patient without  AGAP or significant ketones in urine to suggest DKA Interventions: IVF bolus  Re-evaluation: Patients serum glucose downtrended significantly with stable electrolytes and no anion gap at this time.  Disposition: Discharge home with metformin prescription and prompt PCP follow up instructions.      ____________________________________________   FINAL CLINICAL IMPRESSION(S) / ED DIAGNOSES  Final diagnoses:  Hyperglycemia     ED Discharge Orders          Ordered    metFORMIN (GLUCOPHAGE) 500 MG tablet  Daily with breakfast        05/20/21 1832             Note:  This document was prepared using Dragon voice recognition software and may include unintentional dictation errors.    Merwyn Katos, MD 05/20/21 [phone]

## 2021-05-20 NOTE — ED Notes (Addendum)
Assumed care of patient. Patient AOX4 resp even, unlabored on RA. Denies acute pain. Denies needs at this time.

## 2021-06-25 DIAGNOSIS — Z79899 Other long term (current) drug therapy: Secondary | ICD-10-CM | POA: Insufficient documentation

## 2021-06-25 DIAGNOSIS — I10 Essential (primary) hypertension: Secondary | ICD-10-CM | POA: Insufficient documentation

## 2021-06-25 DIAGNOSIS — I619 Nontraumatic intracerebral hemorrhage, unspecified: Secondary | ICD-10-CM | POA: Insufficient documentation

## 2021-06-25 DIAGNOSIS — Z20822 Contact with and (suspected) exposure to covid-19: Secondary | ICD-10-CM | POA: Insufficient documentation
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
## 2021-06-26 ENCOUNTER — Inpatient Hospital Stay (HOSPITAL_COMMUNITY)
Admission: AD | Admit: 2021-06-26 | Discharge: 2021-07-12 | DRG: 064 | Disposition: A | Discharge status: Home / Self Care | Payer: Self-pay | Source: Other Acute Inpatient Hospital | Attending: Internal Medicine | Admitting: Internal Medicine

## 2021-06-26 ENCOUNTER — Inpatient Hospital Stay (HOSPITAL_COMMUNITY): Payer: Self-pay

## 2021-06-26 ENCOUNTER — Other Ambulatory Visit: Payer: Self-pay

## 2021-06-26 ENCOUNTER — Emergency Department
Admission: EM | Admit: 2021-06-26 | Discharge: 2021-06-26 | Disposition: A | Discharge status: Other Acute Inpatient Hospital | Payer: Self-pay | Attending: Emergency Medicine | Admitting: Emergency Medicine

## 2021-06-26 ENCOUNTER — Emergency Department: Payer: Self-pay

## 2021-06-26 DIAGNOSIS — Z7952 Long term (current) use of systemic steroids: Secondary | ICD-10-CM

## 2021-06-26 DIAGNOSIS — S062X0S Diffuse traumatic brain injury without loss of consciousness, sequela: Secondary | ICD-10-CM

## 2021-06-26 DIAGNOSIS — M109 Gout, unspecified: Secondary | ICD-10-CM | POA: Diagnosis present

## 2021-06-26 DIAGNOSIS — M069 Rheumatoid arthritis, unspecified: Secondary | ICD-10-CM | POA: Diagnosis present

## 2021-06-26 DIAGNOSIS — Z8673 Personal history of transient ischemic attack (TIA), and cerebral infarction without residual deficits: Secondary | ICD-10-CM

## 2021-06-26 DIAGNOSIS — Z79631 Long term (current) use of antimetabolite agent: Secondary | ICD-10-CM

## 2021-06-26 DIAGNOSIS — H5021 Vertical strabismus, right eye: Secondary | ICD-10-CM | POA: Diagnosis present

## 2021-06-26 DIAGNOSIS — Z7984 Long term (current) use of oral hypoglycemic drugs: Secondary | ICD-10-CM

## 2021-06-26 DIAGNOSIS — E1159 Type 2 diabetes mellitus with other circulatory complications: Secondary | ICD-10-CM

## 2021-06-26 DIAGNOSIS — R1310 Dysphagia, unspecified: Secondary | ICD-10-CM | POA: Diagnosis present

## 2021-06-26 DIAGNOSIS — Z9114 Patient's other noncompliance with medication regimen: Secondary | ICD-10-CM

## 2021-06-26 DIAGNOSIS — I619 Nontraumatic intracerebral hemorrhage, unspecified: Secondary | ICD-10-CM

## 2021-06-26 DIAGNOSIS — R270 Ataxia, unspecified: Secondary | ICD-10-CM | POA: Diagnosis present

## 2021-06-26 DIAGNOSIS — E785 Hyperlipidemia, unspecified: Secondary | ICD-10-CM | POA: Diagnosis present

## 2021-06-26 DIAGNOSIS — R636 Underweight: Secondary | ICD-10-CM | POA: Diagnosis present

## 2021-06-26 DIAGNOSIS — I10 Essential (primary) hypertension: Secondary | ICD-10-CM | POA: Diagnosis present

## 2021-06-26 DIAGNOSIS — R509 Fever, unspecified: Secondary | ICD-10-CM

## 2021-06-26 DIAGNOSIS — R482 Apraxia: Secondary | ICD-10-CM | POA: Diagnosis present

## 2021-06-26 DIAGNOSIS — I6389 Other cerebral infarction: Secondary | ICD-10-CM

## 2021-06-26 DIAGNOSIS — G9389 Other specified disorders of brain: Secondary | ICD-10-CM

## 2021-06-26 DIAGNOSIS — E1169 Type 2 diabetes mellitus with other specified complication: Principal | ICD-10-CM

## 2021-06-26 DIAGNOSIS — G935 Compression of brain: Secondary | ICD-10-CM | POA: Diagnosis present

## 2021-06-26 DIAGNOSIS — I618 Other nontraumatic intracerebral hemorrhage: Principal | ICD-10-CM | POA: Diagnosis present

## 2021-06-26 DIAGNOSIS — R29712 NIHSS score 12: Secondary | ICD-10-CM | POA: Diagnosis present

## 2021-06-26 DIAGNOSIS — I161 Hypertensive emergency: Secondary | ICD-10-CM | POA: Diagnosis present

## 2021-06-26 DIAGNOSIS — G8191 Hemiplegia, unspecified affecting right dominant side: Secondary | ICD-10-CM | POA: Diagnosis present

## 2021-06-26 DIAGNOSIS — Q2112 Patent foramen ovale: Secondary | ICD-10-CM

## 2021-06-26 DIAGNOSIS — G936 Cerebral edema: Secondary | ICD-10-CM | POA: Diagnosis present

## 2021-06-26 DIAGNOSIS — Z681 Body mass index (BMI) 19 or less, adult: Secondary | ICD-10-CM

## 2021-06-26 DIAGNOSIS — E1165 Type 2 diabetes mellitus with hyperglycemia: Secondary | ICD-10-CM | POA: Diagnosis present

## 2021-06-26 LAB — ECHOCARDIOGRAM COMPLETE
Area-P 1/2: 4.06 cm2
Height: 70 in
S' Lateral: 2.2 cm
Weight: 2119.94 oz

## 2021-06-26 LAB — PROTIME-INR
INR: 1 (ref 0.8–1.2)
INR: 0.9 (ref 0.8–1.2)
Prothrombin Time: 12.8 seconds (ref 11.4–15.2)
Prothrombin Time: 12.3 seconds (ref 11.4–15.2)

## 2021-06-26 LAB — LIPID PANEL
Cholesterol: 215 mg/dL — ABNORMAL HIGH (ref 0–200)
HDL: 51 mg/dL (ref >40)
LDL Cholesterol: 149 mg/dL — ABNORMAL HIGH (ref 0–99)
Total CHOL/HDL Ratio: 4.2 RATIO
Triglycerides: 77 mg/dL (ref <150)
VLDL: 15 mg/dL (ref 0–40)

## 2021-06-26 LAB — GLUCOSE, CAPILLARY
Glucose-Capillary: 140 mg/dL — ABNORMAL HIGH (ref 70–99)
Glucose-Capillary: 93 mg/dL (ref 70–99)
Glucose-Capillary: 118 mg/dL — ABNORMAL HIGH (ref 70–99)
Glucose-Capillary: 119 mg/dL — ABNORMAL HIGH (ref 70–99)

## 2021-06-26 LAB — CBC
HCT: 43.7 % (ref 39.0–52.0)
HCT: 48.3 % (ref 39.0–52.0)
Hemoglobin: 14 g/dL (ref 13.0–17.0)
Hemoglobin: 15.9 g/dL (ref 13.0–17.0)
MCH: 28.2 pg (ref 26.0–34.0)
MCH: 28.3 pg (ref 26.0–34.0)
MCHC: 32 g/dL (ref 30.0–36.0)
MCHC: 32.9 g/dL (ref 30.0–36.0)
MCV: 87.9 fL (ref 80.0–100.0)
MCV: 86.1 fL (ref 80.0–100.0)
Platelets: 227 10*3/uL (ref 150–400)
Platelets: 228 10*3/uL (ref 150–400)
RBC: 4.97 MIL/uL (ref 4.22–5.81)
RBC: 5.61 MIL/uL (ref 4.22–5.81)
RDW: 14.8 % (ref 11.5–15.5)
RDW: 14.8 % (ref 11.5–15.5)
WBC: 11.6 10*3/uL — ABNORMAL HIGH (ref 4.0–10.5)
WBC: 8 10*3/uL (ref 4.0–10.5)
nRBC: 0 % (ref 0.0–0.2)
nRBC: 0 % (ref 0.0–0.2)

## 2021-06-26 LAB — AMMONIA: Ammonia: 26 umol/L (ref 9–35)

## 2021-06-26 LAB — URINALYSIS, ROUTINE W REFLEX MICROSCOPIC
Bilirubin Urine: NEGATIVE
Glucose, UA: 100 mg/dL — AB
Hgb urine dipstick: NEGATIVE
Ketones, ur: 15 mg/dL — AB
Leukocytes,Ua: NEGATIVE
Nitrite: NEGATIVE
Protein, ur: NEGATIVE mg/dL
Specific Gravity, Urine: 1.02 (ref 1.005–1.030)
pH: 7 (ref 5.0–8.0)

## 2021-06-26 LAB — BASIC METABOLIC PANEL
Anion gap: 9 (ref 5–15)
BUN: 13 mg/dL (ref 6–20)
CO2: 23 mmol/L (ref 22–32)
Calcium: 9.1 mg/dL (ref 8.9–10.3)
Chloride: 104 mmol/L (ref 98–111)
Creatinine, Ser: 0.76 mg/dL (ref 0.61–1.24)
GFR, Estimated: >60 mL/min (ref >60)
Glucose, Bld: 158 mg/dL — ABNORMAL HIGH (ref 70–99)
Potassium: 3.6 mmol/L (ref 3.5–5.1)
Sodium: 136 mmol/L (ref 135–145)

## 2021-06-26 LAB — HEPATIC FUNCTION PANEL
ALT: 16 U/L (ref 0–44)
AST: 23 U/L (ref 15–41)
Albumin: 4.1 g/dL (ref 3.5–5.0)
Alkaline Phosphatase: 78 U/L (ref 38–126)
Bilirubin, Direct: <0.1 mg/dL (ref 0.0–0.2)
Total Bilirubin: 0.9 mg/dL (ref 0.3–1.2)
Total Protein: 7.6 g/dL (ref 6.5–8.1)

## 2021-06-26 LAB — TYPE AND SCREEN
ABO/RH(D): A POS
ABO/RH(D): A POS
Antibody Screen: NEGATIVE
Antibody Screen: NEGATIVE

## 2021-06-26 LAB — HEMOGLOBIN A1C
Hgb A1c MFr Bld: 11.9 % — ABNORMAL HIGH (ref 4.8–5.6)
Mean Plasma Glucose: 294.83 mg/dL

## 2021-06-26 LAB — COMPREHENSIVE METABOLIC PANEL
ALT: 16 U/L (ref 0–44)
AST: 23 U/L (ref 15–41)
Albumin: 4 g/dL (ref 3.5–5.0)
Alkaline Phosphatase: 81 U/L (ref 38–126)
Anion gap: 12 (ref 5–15)
BUN: 12 mg/dL (ref 6–20)
CO2: 26 mmol/L (ref 22–32)
Calcium: 9.4 mg/dL (ref 8.9–10.3)
Chloride: 98 mmol/L (ref 98–111)
Creatinine, Ser: 0.87 mg/dL (ref 0.61–1.24)
GFR, Estimated: >60 mL/min (ref >60)
Glucose, Bld: 141 mg/dL — ABNORMAL HIGH (ref 70–99)
Potassium: 3.6 mmol/L (ref 3.5–5.1)
Sodium: 136 mmol/L (ref 135–145)
Total Bilirubin: 0.7 mg/dL (ref 0.3–1.2)
Total Protein: 8 g/dL (ref 6.5–8.1)

## 2021-06-26 LAB — URINE DRUG SCREEN, QUALITATIVE (ARMC ONLY)
Amphetamines, Ur Screen: NOT DETECTED
Barbiturates, Ur Screen: NOT DETECTED
Benzodiazepine, Ur Scrn: NOT DETECTED
Cannabinoid 50 Ng, Ur ~~LOC~~: NOT DETECTED
Cocaine Metabolite,Ur ~~LOC~~: NOT DETECTED
MDMA (Ecstasy)Ur Screen: NOT DETECTED
Methadone Scn, Ur: NOT DETECTED
Opiate, Ur Screen: NOT DETECTED
Phencyclidine (PCP) Ur S: NOT DETECTED
Tricyclic, Ur Screen: NOT DETECTED

## 2021-06-26 LAB — APTT
aPTT: 27 seconds (ref 24–36)
aPTT: 26 seconds (ref 24–36)

## 2021-06-26 LAB — RESP PANEL BY RT-PCR (FLU A&B, COVID) ARPGX2
Influenza A by PCR: NEGATIVE
Influenza B by PCR: NEGATIVE
SARS Coronavirus 2 by RT PCR: NEGATIVE

## 2021-06-26 LAB — TROPONIN I (HIGH SENSITIVITY): Troponin I (High Sensitivity): 5 ng/L (ref <18)

## 2021-06-26 LAB — CBG MONITORING, ED: Glucose-Capillary: 155 mg/dL — ABNORMAL HIGH (ref 70–99)

## 2021-06-26 LAB — MRSA NEXT GEN BY PCR, NASAL: MRSA by PCR Next Gen: NOT DETECTED

## 2021-06-26 LAB — ETHANOL: Alcohol, Ethyl (B): <10 mg/dL (ref <10)

## 2021-06-26 MED ORDER — LABETALOL HCL 5 MG/ML IV SOLN: 20.0000 mg | Freq: Once | INTRAVENOUS | Status: DC

## 2021-06-26 MED ORDER — INSULIN ASPART 100 UNIT/ML IJ SOLN
0.0000 [IU] | Freq: Three times a day (TID) | INTRAMUSCULAR | Status: DC
  Administered 2021-06-26 – 2021-06-27 (×3): 2 [IU] via SUBCUTANEOUS
  Administered 2021-06-28: 3 [IU] via SUBCUTANEOUS
  Administered 2021-06-28 – 2021-06-29 (×2): 2 [IU] via SUBCUTANEOUS
  Administered 2021-06-29 – 2021-06-30 (×5): 3 [IU] via SUBCUTANEOUS
  Administered 2021-07-01: 5 [IU] via SUBCUTANEOUS
  Administered 2021-07-01: 2 [IU] via SUBCUTANEOUS
  Administered 2021-07-01 – 2021-07-02 (×3): 3 [IU] via SUBCUTANEOUS
  Administered 2021-07-02: 2 [IU] via SUBCUTANEOUS
  Administered 2021-07-03 (×3): 3 [IU] via SUBCUTANEOUS
  Administered 2021-07-04: 12:00:00 5 [IU] via SUBCUTANEOUS
  Administered 2021-07-04: 08:00:00 3 [IU] via SUBCUTANEOUS
  Administered 2021-07-04: 18:00:00 2 [IU] via SUBCUTANEOUS
  Administered 2021-07-05 (×2): 3 [IU] via SUBCUTANEOUS
  Administered 2021-07-05: 18:00:00 8 [IU] via SUBCUTANEOUS
  Administered 2021-07-06: 2 [IU] via SUBCUTANEOUS
  Administered 2021-07-06: 3 [IU] via SUBCUTANEOUS
  Administered 2021-07-06: 2 [IU] via SUBCUTANEOUS
  Administered 2021-07-07 – 2021-07-08 (×3): 3 [IU] via SUBCUTANEOUS
  Administered 2021-07-08 – 2021-07-09 (×2): 2 [IU] via SUBCUTANEOUS
  Administered 2021-07-10: 5 [IU] via SUBCUTANEOUS
  Administered 2021-07-12: 2 [IU] via SUBCUTANEOUS

## 2021-06-26 MED ORDER — PANTOPRAZOLE SODIUM 40 MG IV SOLR
40.0000 mg | Freq: Every day | INTRAVENOUS | Status: DC
  Administered 2021-06-26 – 2021-06-27 (×2): 40 mg via INTRAVENOUS
  Filled 2021-06-26 (×2): qty 40

## 2021-06-26 MED ORDER — GADOBUTROL 1 MMOL/ML IV SOLN
7.0000 mL | Freq: Once | INTRAVENOUS | Status: AC | PRN
  Administered 2021-06-26: 7 mL via INTRAVENOUS

## 2021-06-26 MED ORDER — ACETAMINOPHEN 325 MG PO TABS
650.0000 mg | ORAL_TABLET | ORAL | Status: DC | PRN
  Administered 2021-06-27 – 2021-07-11 (×5): 650 mg via ORAL
  Filled 2021-06-26 (×6): qty 2

## 2021-06-26 MED ORDER — CHLORHEXIDINE GLUCONATE CLOTH 2 % EX PADS
6.0000 | MEDICATED_PAD | Freq: Every day | CUTANEOUS | Status: DC
  Administered 2021-06-26 – 2021-06-30 (×5): 6 via TOPICAL

## 2021-06-26 MED ORDER — ACETAMINOPHEN 650 MG RE SUPP
650.0000 mg | RECTAL | Status: DC | PRN
  Administered 2021-06-26 – 2021-06-27 (×5): 650 mg via RECTAL
  Filled 2021-06-26 (×4): qty 1

## 2021-06-26 MED ORDER — IOHEXOL 350 MG/ML SOLN
75.0000 mL | Freq: Once | INTRAVENOUS | Status: AC | PRN
  Administered 2021-06-26: 75 mL via INTRAVENOUS

## 2021-06-26 MED ORDER — STROKE: EARLY STAGES OF RECOVERY BOOK
Freq: Once | Status: AC
  Filled 2021-06-26: qty 1

## 2021-06-26 MED ORDER — ACETAMINOPHEN 160 MG/5ML PO SOLN: 650.0000 mg | ORAL | Status: DC | PRN

## 2021-06-26 MED ORDER — SODIUM CHLORIDE 0.9 % IV BOLUS
500.0000 mL | Freq: Once | INTRAVENOUS | Status: AC
  Administered 2021-06-26: 500 mL via INTRAVENOUS

## 2021-06-26 MED ORDER — CLEVIDIPINE BUTYRATE 0.5 MG/ML IV EMUL
0.0000 mg/h | INTRAVENOUS | Status: DC
  Filled 2021-06-26: qty 50

## 2021-06-26 MED ORDER — SENNOSIDES-DOCUSATE SODIUM 8.6-50 MG PO TABS
1.0000 | ORAL_TABLET | Freq: Two times a day (BID) | ORAL | Status: DC
  Administered 2021-06-27 – 2021-07-11 (×19): 1 via ORAL
  Filled 2021-06-26 (×29): qty 1

## 2021-06-26 MED ORDER — CLEVIDIPINE BUTYRATE 0.5 MG/ML IV EMUL
0.0000 mg/h | INTRAVENOUS | Status: DC
  Administered 2021-06-26: 2 mg/h via INTRAVENOUS
  Administered 2021-06-26: 4 mg/h via INTRAVENOUS
  Administered 2021-06-27: 2 mg/h via INTRAVENOUS
  Filled 2021-06-26 (×2): qty 50

## 2021-06-26 MED ORDER — SODIUM CHLORIDE 0.9 % IV SOLN: INTRAVENOUS | Status: AC

## 2021-06-26 NOTE — Progress Notes (Signed)
°  Echocardiogram 2D Echocardiogram has been performed.  Leta Jungling M 06/26/2021, 9:43 AM

## 2021-06-26 NOTE — ED Notes (Signed)
[facility] at bedside at this time, unable to obtain E-sig due to patient condition.

## 2021-06-26 NOTE — Progress Notes (Signed)
Chaplain responded to Code Stroke, speaking briefly to pt, to offer support. Pt, who was very sleepy, declined needs at this time. No family is present.  Please call if follow-up is needed.    Belia HemanKristina N Jahnae Mcadoo, IowaChaplain Pager:  [phone]    06/26/21 0200  Clinical Encounter Type  Visited With Patient  Visit Type Initial;Code  Referral From Physician  Consult/Referral To Chaplain  Stress Factors  Patient Stress Factors Health changes
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
## 2021-06-26 NOTE — Progress Notes (Signed)
PT Cancellation Note  Patient Details Name: SHIVIN MCLARTY MRN: [id number] DOB: 06-25-1970   Cancelled Treatment:    Reason Eval/Treat Not Completed: Active bedrest order - will check back when medically appropriate.   Marye Round, PT DPT Acute Rehabilitation Services Pager [phone]  Office [phone]    Truddie Coco 06/26/2021, 1:39 PM

## 2021-06-26 NOTE — Plan of Care (Signed)
ON CALL PHONE CONSULT  Call from [name] NP Domingo Pulse @ 0430   Discussion: 50/M with left thalamic ICH. Calling to see if appropriate for [facility] admission. Reviewing the scan and tele neuro notes with NIHSS 12 - I think he, per our [place] policy, should be transferred to [facility]. NSGY at [facility] was curbsided I believe and no emergent procedure for now. I then spoke with Dr. Tamala Julian from ER and accepted patient to neuro ICU at [facility] under Neurohospitalist/stroke service via [facility] 3 way call. Will see the patient when he gets to the ICU. Strict BP goal <140 and cleviprex advised.   -- Amie Portland, MD Neurologist [facility] Pager: [phone]

## 2021-06-26 NOTE — ED Triage Notes (Signed)
Pt BIB via EMS. Pt seems confused on why he was brought to ED. Pt cannot answer questions appropriately at this time. Pt denies alcohol or drugs use.

## 2021-06-26 NOTE — Progress Notes (Signed)
OT Cancellation Note  Patient Details Name: FONTAINE BEATO MRN: [id number] DOB: 1971/04/16   Cancelled Treatment:    Reason Eval/Treat Not Completed: Active bedrest order (Will return as schedule allows. Thank you.)  Starsha Morning M Krissi Willaims Daxtin Leiker MSOT, OTR/L Acute Rehab Pager: [phone] Office: [phone] 06/26/2021, 2:47 PM

## 2021-06-26 NOTE — Progress Notes (Signed)
°  Transition of Care [facility]) Screening Note   Patient Details  Name: Patrick Carter Date of Birth: 1971-05-31   Transition of Care [facility]) CM/SW Contact:    Mearl Latin, LCSW Phone Number: 06/26/2021, 9:16 AM    Transition of Care Department [facility]) has reviewed patient and no TOC needs have been identified at this time. We will continue to monitor patient advancement through interdisciplinary progression rounds. If new patient transition needs arise, please place a TOC consult.

## 2021-06-26 NOTE — ED Notes (Signed)
Activated Code Stroke W/[facility] @ [phone]

## 2021-06-26 NOTE — ED Provider Notes (Addendum)
[facility] Provider Note    Event Date/Time   First MD Initiated Contact with Patient 06/26/21 0209     (approximate)   History   Confusion    HPI  Patrick Carter is a 51 y.o. 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  Patient is very confused on arrival and unable to write any additional history although he is denying any illicit drug use, EtOH use or falls or acute pain.  He is repeatedly falling asleep during initial interview.  Further history limited secondary to patient's altered mental status.      Physical Exam  Triage Vital Signs: ED Triage Vitals  Enc Vitals Group     BP 06/26/21 0103 (!) 150/72     Pulse Rate 06/26/21 0103 95     Resp 06/26/21 0103 16     Temp 06/26/21 0103 98.6 F (37 C)     Temp Source 06/26/21 0103 Oral     SpO2 06/26/21 0103 96 %     Weight --      Height --      Head Circumference --      Peak Flow --      Pain Score 06/26/21 0114 0     Pain Loc --      Pain Edu? --      Excl. in GC? --     Most recent vital signs: Vitals:   06/26/21 0320 06/26/21 0343  BP: (!) 131/98 (!) 148/96  Pulse: 78 81  Resp: 16 16  Temp:    SpO2: 97% 97%    General: Patient is lethargic and very sleepy but arousable to voice.  He is able to state his name. CV:  Good peripheral perfusion.  2+ radial pulses.  2+ DP pulses.  No murmurs rubs or gallops Resp:  Normal effort.  Clear bilaterally. Abd:  No distention.  Soft throughout. Other:  PERRLA.  EOMI.  Patient is able to move all extremities although does seem significantly weaker in his right upper extremity and right lower extremity compared to the left.  Sensation is intact light touch throughout.  He is unable to participate in pronator drift testing or finger  dysmetria testing.  He is quite altered and not provide any additional history.   ED Results / Procedures / Treatments  Labs (all labs ordered are listed, but only abnormal results are displayed) Labs Reviewed  CBC - Abnormal; Notable for the following components:      Result Value   WBC 11.6 (*)    All other components within normal limits  BASIC METABOLIC PANEL - Abnormal; Notable for the following components:   Glucose, Bld 158 (*)    All other components within normal limits  URINALYSIS, ROUTINE W REFLEX MICROSCOPIC - Abnormal; Notable for the following components:   Glucose, UA 100 (*)    Ketones, ur 15 (*)    All other components within normal limits  CBG MONITORING, ED - Abnormal; Notable for the following components:   Glucose-Capillary 155 (*)    All other components within normal limits  RESP PANEL BY RT-PCR (FLU A&B, COVID) ARPGX2  URINE DRUG SCREEN, QUALITATIVE ([facility] ONLY)  HEPATIC FUNCTION PANEL  AMMONIA  ETHANOL  PROTIME-INR  APTT  TYPE AND  SCREEN  TROPONIN I (HIGH SENSITIVITY)     EKG  EKG reviewed by myself shows a sinus rhythm with a ventricular rate of 88, normal axis, unremarkable intervals with nonspecific ST change in V2 without other clear evidence of acute ischemia or significant arrhythmia.   RADIOLOGY  CT head reviewed by myself is markable for an acute left thalamic bleed with some 5 mm left-to-right midline shift and chronic bilateral basal ganglia lacunar infarcts.  I also reviewed radiologist or potation agree with her findings.  I also discussed this with on-call radiologist Dr. Londell MohHouston.  CTA head and neck shows no large vessel occlusion or stenosis or likely acute process.  I also reviewed radiology's findings and agree with their interpretation.   PROCEDURES:  Critical Care performed: Yes, see critical care procedure note(s)  .Critical Care Performed by: Gilles ChiquitoSmith, Selso Mannor P, MD Authorized by: Gilles ChiquitoSmith, Marcques Wrightsman P, MD   Critical care  provider statement:    Critical care time (minutes):  75   Critical care was necessary to treat or prevent imminent or life-threatening deterioration of the following conditions:  CNS failure or compromise   Critical care was time spent personally by me on the following activities:  Development of treatment plan with patient or surrogate, discussions with consultants, evaluation of patient's response to treatment, examination of patient, ordering and review of laboratory studies, ordering and review of radiographic studies, ordering and performing treatments and interventions, pulse oximetry, re-evaluation of patient's condition and review of old charts .1-3 Lead EKG Interpretation Performed by: Gilles ChiquitoSmith, Kyrsten Deleeuw P, MD Authorized by: Gilles ChiquitoSmith, Rene Sizelove P, MD     Interpretation: normal     ECG rate assessment: normal     Rhythm: sinus rhythm     Ectopy: none     Conduction: normal    The patient is on the cardiac monitor to evaluate for evidence of arrhythmia and/or significant heart rate changes.   MEDICATIONS ORDERED IN ED: Medications  iohexol (OMNIPAQUE) 350 MG/ML injection 75 mL (75 mLs Intravenous Contrast Given 06/26/21 0331)     IMPRESSION / MDM / ASSESSMENT AND PLAN / ED COURSE  I reviewed the triage vital signs and the nursing notes.                              Differential diagnosis includes, but is not limited to acute intracranial hemorrhage, CVA, metabolic derangements, trauma, arrhythmia, anemia, sepsis and toxic ingestion.  EKG reviewed by myself shows a sinus rhythm with a ventricular rate of 88, normal axis, unremarkable intervals with nonspecific ST change in V2 without other clear evidence of acute ischemia or significant arrhythmia.  Nonelevated discussed.  CT head reviewed by myself is markable for an acute left thalamic bleed with some 5 mm left-to-right midline shift and chronic bilateral basal ganglia lacunar infarcts.  I also reviewed radiologist or potation agree  with her findings.  I also discussed this with on-call radiologist Dr. Londell MohHouston.  CTA head and neck shows no large vessel occlusion or stenosis or likely acute process.  I also reviewed radiology's findings and agree with their interpretation.  CBC remarkable for WBC count of 11.6 without evidence of acute anemia and normal platelets.  BMP shows a glucose of 158 without any other significant electrolyte or metabolic derangements.  Hepatic function panel is unremarkable.  Serum ethanol is undetectable.  Ammonia is within normal limits.  INR PTT are unremarkable.  COVID and influenza PCR negative.  UDS negative.  UA has some glucose and ketones but no evidence of infection  On patient being bedded back to the ED and on my review of CT I immediately called neurosurgery and they tried a code stroke.  I was informed by neurosurgery that unless there is intraventricular bleeding this would be primarily managed by neurology.  Was informed by neurology telemetry triage nurse that this would be a stat consult and not code stroke.  I did discuss with on-call neurologist who interviewed and examined patient over video who recommended ICU admission for close neurochecks, blood pressure control, telemetry and maintenance of euglycemia.  Also again recommended neurosurgery consult and I did reach out to neurosurgery again who confirmed that they are typically not involved unless intraventricular hemorrhage.  ICU provider did speak with Dr. Wilford Corner at [facility] given some concerns about findings of IPH and patient's somnolent.  Patient is accepted for transfer and will be transferred to the medical neuro ICU unit .      FINAL CLINICAL IMPRESSION(S) / ED DIAGNOSES   Final diagnoses:  Intraparenchymal hemorrhage of brain (HCC)     Rx / DC Orders   ED Discharge Orders     None        Note:  This document was prepared using Dragon voice recognition software and may include unintentional dictation errors.   Gilles Chiquito, MD 06/26/21 4540    Gilles Chiquito, MD 06/26/21 [phone]

## 2021-06-26 NOTE — Consult Note (Signed)
[facility] TeleNeurology Consult Services  Stat Consult  Patient Name:   Patrick Carter, Patrick Carter Date of Birth:   09/04/1970 Identification Number:   MRN - [id number] Date of Service:   06/26/2021 02:34:13  Diagnosis:       I61.9 - Intracerebral haemorrhage, unspecified  Impression 50yoM hx of DM present with confusion and right side weakness. CTH shows left thalamic hemorrhage. On exam he was somnolent, able to stay awake to participate in exam, has right arm and leg weakness. Per ED physician, neurosurgery was called and no intervention since this is not intraventricular hemorrhage.  Recommend admission for ICH, repeat CTH, obtain CTA, BP management, admit to ICU for q1-2hr montioring, CTA pending. If patient's mental status worsen or repeat CTH shows worsening bleed, repeat consult to neurosurgery or consider transfer to location that has STAT neursurgery intervention available.  Our recommendations are outlined below.  Diagnostic Studies: Repeat CT head in first 8-12hrs CTA head and neck with contrast  Laboratory Studies: INR/PT, aPTT, CBC  Medications: Hold antiplatelet therapy/NSAIDS/Anticoagulation  Nursing recommendations: Telemetry, IV Fluids. Avoid dextrose containing fluids, Maintain euglycemia. Head of bed 30 degrees Neuro checks q1-2 hrs during ICU stay Once stable neuro checks q4 hrs BP control, lower SBP to less than 160 with goal of 140s  Consultations: Need Neurosurgery consultation STAT Recommend Speech therapy if failed dysphagia screen Physical therapy/Occupational therapy  DVT Prophylaxis: SCDs  Disposition: Neurology will follow   Metrics: [facility] Notification Time: 06/26/2021 02:34:13 Stamp Time: 06/26/2021 02:34:13 Callback Response Time: 06/26/2021 02:39:05   ----------------------------------------------------------------------------------------------------  Chief Complaint: ICH  History of Present  Illness: Patient is a 52 year old Male. Per report patient was found outside, said he has been down for 2 hours. He was initally confused and not answering questions appropriately. He was noted to have right side weaknes and CTH done showed left thalamic ICH.   Past Medical History:      Diabetes Mellitus Other PMH:  arthritis pain  Medications:  No Anticoagulant use  Antiplatelet use: Yes aspirin Reviewed EMR for current medications  Allergies:  Reviewed,NKDA  Social History: Smoking: No Alcohol Use: No  Family History:  There is no family history of premature cerebrovascular disease pertinent to this consultation  ROS : 14 Points Review of Systems was performed and was negative except mentioned in HPI.  Past Surgical History: There Is No Surgical History Contributory To Todays Visit   Examination: BP(149/114), Pulse(88), Blood Glucose(100) 1A: Level of Consciousness - Alert; keenly responsive + 0 1B: Ask Month and Age - Could Not Answer Either Question Correctly + 2 1C: Blink Eyes & Squeeze Hands - Performs Both Tasks + 0 2: Test Horizontal Extraocular Movements - Normal + 0 3: Test Visual Fields - Partial Hemianopia + 1 4: Test Facial Palsy (Use Grimace if Obtunded) - Minor paralysis (flat nasolabial fold, smile asymmetry) + 1 5A: Test Left Arm Motor Drift - No Drift for 10 Seconds + 0 5B: Test Right Arm Motor Drift - No Effort Against Gravity + 3 6A: Test Left Leg Motor Drift - No Drift for 5 Seconds + 0 6B: Test Right Leg Motor Drift - Some Effort Against Gravity + 2 7: Test Limb Ataxia (FNF/Heel-Shin) - No Ataxia + 0 8: Test Sensation - Mild-Moderate Loss: Can Sense Being Touched + 1 9: Test Language/Aphasia - Mild-Moderate Aphasia: Some Obvious Changes, Without Significant Limitation + 1 10: Test Dysarthria - Normal + 0 11: Test Extinction/Inattention - Visual/tactile/auditory/spatial/personal inattention + 1  NIHSS Score: 12 NIHSS  Free Text : decrease  blink to threat on the right decrease sensation on the right struggle to read sentence and name pictures. Able to name physical objects     Patient / Family was informed the Neurology Consult would occur via TeleHealth consult by way of interactive audio and video telecommunications and consented to receiving care in this manner.  Patient is being evaluated for possible acute neurologic impairment and high probability of imminent or life - threatening deterioration.I spent total of 35 minutes providing care to this patient, including time for face to face visit via telemedicine, review of medical records, imaging studies and discussion of findings with providers, the patient and / or family.   Dr Edward Jolly   [facility] [phone]  Case [id number]

## 2021-06-26 NOTE — Progress Notes (Signed)
SLP Cancellation Note  Patient Details Name: Patrick Carter MRN: [id number] DOB: 06-Jul-1970   Cancelled treatment:       Reason Eval/Treat Not Completed: Fatigue/lethargy limiting ability to participate  RN reports pt alertness is sub-optimal for evaluation at this time. MRI is pending. Will continue efforts for BSE/SLE.  Kayon Dozier B. Murvin Natal, [facility], CCC-SLP Speech Language Pathologist Office: [phone]  Leigh Aurora 06/26/2021, 12:05 PM

## 2021-06-27 ENCOUNTER — Inpatient Hospital Stay (HOSPITAL_COMMUNITY): Payer: Self-pay

## 2021-06-27 DIAGNOSIS — I610 Nontraumatic intracerebral hemorrhage in hemisphere, subcortical: Secondary | ICD-10-CM

## 2021-06-27 LAB — GLUCOSE, CAPILLARY
Glucose-Capillary: 111 mg/dL — ABNORMAL HIGH (ref 70–99)
Glucose-Capillary: 133 mg/dL — ABNORMAL HIGH (ref 70–99)
Glucose-Capillary: 130 mg/dL — ABNORMAL HIGH (ref 70–99)
Glucose-Capillary: 114 mg/dL — ABNORMAL HIGH (ref 70–99)

## 2021-06-27 MED ORDER — AMLODIPINE BESYLATE 5 MG PO TABS
5.0000 mg | ORAL_TABLET | Freq: Every day | ORAL | Status: DC
  Administered 2021-06-27 – 2021-07-06 (×10): 5 mg via ORAL
  Filled 2021-06-27 (×10): qty 1

## 2021-06-27 MED ORDER — METHOTREXATE 2.5 MG PO TABS
15.0000 mg | ORAL_TABLET | ORAL | Status: DC
  Administered 2021-06-29 – 2021-07-06 (×2): 15 mg via ORAL
  Filled 2021-06-27 (×3): qty 6

## 2021-06-27 MED ORDER — FOLIC ACID 1 MG PO TABS
1.0000 mg | ORAL_TABLET | Freq: Every day | ORAL | Status: DC
  Administered 2021-06-27 – 2021-07-12 (×16): 1 mg via ORAL
  Filled 2021-06-27 (×17): qty 1

## 2021-06-27 MED ORDER — ONDANSETRON 4 MG PO TBDP: 4.0000 mg | ORAL_TABLET | Freq: Three times a day (TID) | ORAL | Status: DC | PRN

## 2021-06-27 NOTE — Progress Notes (Signed)
Inpatient Diabetes Program Recommendations  AACE/ADA: New Consensus Statement on Inpatient Glycemic Control (2015)  Target Ranges:  Prepandial:   less than 140 mg/dL      Peak postprandial:   less than 180 mg/dL (1-2 hours)      Critically ill patients:  140 - 180 mg/dL   Lab Results  Component Value Date   GLUCAP 133 (H) 06/27/2021   HGBA1C 11.9 (H) 06/26/2021    Review of Glycemic Control  Diabetes history: DM2 Outpatient Diabetes medications: Metformin 500 mg QAM Current orders for Inpatient glycemic control: Novolog 0-15 units TID  Referral received for A1C of 11.9%.  Will see patient when more alert.  Ordered LWWD Booklet and insulin starter kit.  Will continue to follow while inpatient.  Thank you, Reche Dixon, MSN, RN Diabetes Coordinator Inpatient Diabetes Program [phone] (team pager from 8a-5p)

## 2021-06-27 NOTE — Progress Notes (Signed)
OT Cancellation Note  Patient Details Name: WESTYN SKOUSEN MRN: [id number] DOB: 13-Apr-1971   Cancelled Treatment:    Reason Eval/Treat Not Completed: Medical issues which prohibited therapy (BP 155/106 at rest. Notified RN. Will return as pt medically ready and schedule allows. Thank you.)  Quiana Cobaugh M Nox Talent Nekeya Briski MSOT, OTR/L Acute Rehab Pager: [phone] Office: [phone] 06/27/2021, 2:32 PM

## 2021-06-27 NOTE — Evaluation (Signed)
Speech Language Pathology Evaluation Patient Details Name: Patrick Carter MRN: [id number] DOB: 05/13/1971 Today's Date: 06/27/2021 Time: 1000 (Simultaneous filing. User may not have seen previous data.)-1045 (Simultaneous filing. User may not have seen previous data.) SLP Time Calculation (min) (ACUTE ONLY): 45 min (Simultaneous filing. User may not have seen previous data.)  Problem List:  Patient Active Problem List   Diagnosis Date Noted   ICH (intracerebral hemorrhage) (HCC) 06/26/2021   Hypertension complicating diabetes (HCC) 06/26/2021   Hypertensive emergency 06/26/2021   Rheumatoid arthritis (HCC) 06/26/2021   Past Medical History:  Past Medical History:  Diagnosis Date   Arthritis    Gout    Hypertension    Stroke [facility])    Past Surgical History: No past surgical history on file. HPI:  51 y.o. 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 ICH score of 1(for GCS of 12). Exam with R sided weakness, ataxia, sensory deficit and extinction. Likely had a lacune that bled. Plan is strict BP control, repeat imaging and stroke workup. Not on AC. (Simultaneous filing. User may not have seen previous data.)   Assessment / Plan / Recommendation Clinical Impression  Pt demonstrates impaired language and motor speech abilities; Pt has signs of apraxia including impaired accuracy with oral motor commands despite relatively good auditory comprehension and strength in speech and swallowing tasks. Additionally pt with limited verbal output, only responding verbally on command or with cues, not making requests. In confrontational naming tasks pt demonstrated less than 50% accuracy (improved with sentence completion and phonemic cues), and also improved significantly in all complex auditory comprehenstion tasks with verbal cueing. Pts function limited by briefly sustained eyes open despite adeqaute arousal; pt appeared to have some type of visual  impiarment, possibly field cut or double vision. Gaze often deviated to the left. Difficult to fully assess cognition given speech/language impairment. Will f/u to facilitate functional communication; recommend AIR at d/c.    SLP Assessment  SLP Recommendation/Assessment: Patient needs continued Speech Lanaguage Pathology Services SLP Visit Diagnosis: Dysphagia, unspecified (R13.10);Cognitive communication deficit (R41.841);Aphasia (R47.01);Apraxia (R48.2)    Recommendations for follow up therapy are one component of a multi-disciplinary discharge planning process, led by the attending physician.  Recommendations may be updated based on patient status, additional functional criteria and insurance authorization.    Follow Up Recommendations  Acute inpatient rehab (3hours/day)    Assistance Recommended at Discharge  PRN  Functional Status Assessment Patient has had a recent decline in their functional status and demonstrates the ability to make significant improvements in function in a reasonable and predictable amount of time.  Frequency and Duration min 2x/week  2 weeks      SLP Evaluation Cognition  Overall Cognitive Status: Impaired/Different from baseline Arousal/Alertness: Awake/alert Orientation Level: Oriented to person;Disoriented to situation Attention: Sustained Sustained Attention: Appears intact Memory:  (Unable to assess) Awareness: Appears intact Problem Solving:  (Not tested)       Comprehension  Auditory Comprehension Overall Auditory Comprehension: Impaired Yes/No Questions: Impaired Basic Biographical Questions: 76-100% accurate Basic Immediate Environment Questions: 75-100% accurate Complex Questions: 50-74% accurate Commands: Impaired One Step Basic Commands: 75-100% accurate Two Step Basic Commands: 75-100% accurate Complex Commands: 0-24% accurate Conversation: Simple Interfering Components: Pain;Visual impairments;Motor planning;Working  memory EffectiveTechniques: Therapist, music Expression Overall Verbal Expression: Appears within functional limits for tasks assessed Initiation: No impairment Automatic Speech: Name;Social Response Level of Generative/Spontaneous Verbalization: Phrase;Word Repetition: Impaired Level of Impairment: Phrase level;Sentence  level Naming: Impairment Confrontation: Impaired Convergent: 25-49% accurate Verbal Errors: Perseveration;Aware of errors Pragmatics: Impairment Impairments: Eye contact (Initiation/Expression of wants/needs) Effective Techniques: Phonemic cues;Sentence completion Non-Verbal Means of Communication: Gestures Written Expression Dominant Hand: Right Written Expression: Not tested   Oral / Motor  Oral Motor/Sensory Function Overall Oral Motor/Sensory Function: Other (comment) (Reduced range of motion; Groping behaviors with oral motor exam  Simultaneous filing. User may not have seen previous data.) Facial Symmetry: Within Functional Limits Motor Speech Overall Motor Speech: Impaired Respiration: Within functional limits Phonation: Low vocal intensity Resonance: Within functional limits Articulation: Within functional limitis Intelligibility: Intelligibility reduced Word: 75-100% accurate Phrase: 50-74% accurate Sentence: 25-49% accurate Conversation: 0-24% accurate Motor Planning: Impaired Level of Impairment: Word Motor Speech Errors: Aware;Groping for words;Inconsistent            Patrick Carter 06/27/2021, 11:46 AM

## 2021-06-27 NOTE — Progress Notes (Signed)
PT Cancellation Note  Patient Details Name: Patrick Carter MRN: [id number] DOB: 25-Jun-1970   Cancelled Treatment:    Reason Eval/Treat Not Completed: Fatigue/lethargy limiting ability to participate;Active bedrest order. Will check back as time allows.   Leighton Roach, PT  Acute Rehab Services  Pager [phone] Office [place] 06/27/2021, 9:07 AM

## 2021-06-27 NOTE — Progress Notes (Addendum)
STROKE TEAM PROGRESS NOTE   INTERVAL HISTORY Patient is seen in his room with no family at the bedside.  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  Neurosurgery was consulted but did not wish to intervene at this time.  He was then transferred here for further evaluation and treatment. Blood pressure has been controlled on Cleviprex drip.  He appears sleepy with eyes closed but can follow commands quite well.  MRI scan of the brain shows stable left thalamic hemorrhage without significant intraventricular extension but there appears to be slight extension downwards to the top of the midbrain.  There is mild cytotoxic edema and brain compression but no hydrocephalus Vitals:   06/27/21 1230 06/27/21 1245 06/27/21 1300 06/27/21 1315  BP: (!) 132/109 (!) 142/109 (!) 150/105 (!) 141/96  Pulse: 95 85 82 78  Resp: 18 18 20 19   Temp:      TempSrc:      SpO2: 99% 98% 96% 97%  Weight:      Height:       CBC:  Recent Labs  Lab 06/26/21 0126 06/26/21 0920  WBC 11.6* 8.0  HGB 14.0 15.9  HCT 43.7 48.3  MCV 87.9 86.1  PLT 227 [id number]   Basic Metabolic Panel:  Recent Labs  Lab 06/26/21 0126 06/26/21 0920  NA 136 136  K 3.6 3.6  CL 104 98  CO2 23 26  GLUCOSE 158* 141*  BUN 13 12  CREATININE 0.76 0.87  CALCIUM 9.1 9.4   Lipid Panel:  Recent Labs  Lab 06/26/21 0920  CHOL 215*  TRIG 77  HDL 51  CHOLHDL 4.2  VLDL 15  LDLCALC 149*   HgbA1c:  Recent Labs  Lab 06/26/21 0920  HGBA1C 11.9*   Urine Drug Screen:  Recent Labs  Lab 06/26/21 0224  LABOPIA NONE DETECTED  COCAINSCRNUR NONE DETECTED  LABBENZ NONE DETECTED  AMPHETMU NONE DETECTED  THCU NONE DETECTED  LABBARB NONE DETECTED    Alcohol Level  Recent Labs  Lab 06/26/21 0140  ETH <10    IMAGING past 24 hours MR BRAIN W WO CONTRAST  Result Date: 06/26/2021 CLINICAL DATA:  Provided history: Transient ischemic attack (TIA); stroke, follow-up;  neuro deficit, acute, stroke suspected. EXAM: MRI HEAD WITHOUT AND WITH CONTRAST TECHNIQUE: Multiplanar, multiecho pulse sequences of the brain and surrounding structures were obtained without and with intravenous contrast. CONTRAST:  17mL GADAVIST GADOBUTROL 1 MMOL/ML IV SOLN COMPARISON:  Head CT examinations performed earlier today 06/26/2021. CT angiogram head/neck 06/26/2021. FINDINGS: Brain: Cerebral volume appears normal for age. Redemonstrated 2.7 x 2.5 cm acute parenchymal hemorrhage centered within the left thalamus and corona radiata. Mild surrounding edema, similar to slightly increased. As before, there is local mass effect with partial effacement of the third ventricle. No midline shift measured at the septum pellucidum. No evidence of obstructive hydrocephalus or intraventricular extension of hemorrhage at this time. No suspicious masslike or nodular enhancement at the hemorrhage site. Mild multifocal T2 FLAIR hyperintense abnormality elsewhere within the cerebral white matter, nonspecific but compatible with chronic small vessel ischemic disease. Small T2 hyperintense foci within the bilateral basal ganglia have appearance most suggestive of prominent perivascular spaces. No extra-axial fluid collection. Vascular: Maintained flow voids within the proximal large arterial vessels. Skull and upper cervical spine: Focal suspicious marrow lesion. Sinuses/Orbits: Visualized orbits show no acute finding. Mild mucosal thickening within the left frontal and bilateral ethmoid sinuses. Small mucous retention  cysts within the right maxillary sinus. Trace mucosal thickening within the left maxillary sinus. IMPRESSION: 2.7 x 2.5 cm acute parenchymal hemorrhage centered within the left thalamus and corona radiata, stable in size as compared to the head CT performed earlier today at 8:34 a.m. Mild surrounding edema, similar to slightly increased. As before, there is local mass effect with partial effacement of the  third ventricle. No evidence of obstructive hydrocephalus or intraventricular extension of hemorrhage at this time. No suspicious masslike or nodular enhancement at the hemorrhage site. Background mild chronic small vessel ischemic changes within the cerebral white matter. Paranasal sinus disease, as described. Electronically Signed   By: Jackey Loge D.O.   On: 06/26/2021 17:28   DG CHEST PORT 1 VIEW  Result Date: 06/27/2021 CLINICAL DATA:  Fever, history of brain bleed EXAM: PORTABLE CHEST 1 VIEW COMPARISON:  12/20/2020 FINDINGS: No focal consolidation. No pleural effusion or pneumothorax. Heart and mediastinal contours are unremarkable. No acute osseous abnormality. IMPRESSION: No active disease. Electronically Signed   By: Elige Ko M.D.   On: 06/27/2021 09:02    PHYSICAL EXAM General:  Alert, thin-appearing male in no acute distress   NEURO:  Mental Status: Eyes are closed has some eye opening apraxia but will follow commands appropriately.  AA&Ox2, slow to respond and will perseverate on different words and phrases at times Speech/Language: speech is hypophonic but without dysarthria or aphasia.    Cranial Nerves:  II: PERRL.  III, IV, VI: Gaze deviated downward, unable to look upwards with upgaze palsy..  Vertical gaze dysconjugate with few beats of vertical nystagmus.  Preserved horizontal eye movements. V: Sensation is intact to light touch and symmetrical to face.  VII: Smile is symmetrical.  VIII: hearing intact to voice. IX, X:  Phonation is normal.  XII: tongue is midline without fasciculations. Motor: 5/5 strength to all muscle groups tested.  Sensation- Intact to light touch bilaterally.  Gait- deferred   ASSESSMENT/PLAN Patrick Carter is a 51 y.o. 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  Neurosurgery was consulted but did not wish to  intervene at this time.  He was then transferred here for further evaluation and treatment.  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 Yes; Fluid consistency: Thin   No antithrombotic prior to admission, now on No antithrombotic secondary to IPH Therapy recommendations:  pending Disposition:  pending  Hypertension Home meds:  none Stable at this time BP goal 120-140 Long-term BP goal normotensive  Hyperlipidemia Home meds:  none, LDL 149, goal < 70 High intensity statin not indicated at this time due to IPH Start high intensity statin at discharge  Diabetes type II Uncontrolled Home meds:  metformin 500 mg daily HgbA1c 11.9, goal < 7.0 CBGs Recent Labs    06/26/21 2146 06/27/21 0740 06/27/21 1135  GLUCAP  119* 111* 133*   Diabetes coordinator consult SSI  Other Stroke Risk Factors Hx stroke   Other Active Problems none  Hospital day # [place] , MSN, AGACNP-BC [facility] See Amion for schedule and pager information 06/27/2021 1:35 PM  STROKE MD NOTE :  I have personally obtained history,examined this patient, reviewed notes, independently viewed imaging studies, participated in medical decision making and plan of care.ROS completed by me personally and pertinent positives fully documented  I have made any additions or clarifications directly to the above note. Agree with note above.  Patient presented with left thalamic  intracerebral hemorrhage with mild cytotoxic edema but no hydrocephalus likely secondary to tension.  Continue close neurological observation with strict blood pressure control with systolic blood pressure goal between 1 30-1 50 for first 24 hours and then below 160.  He was given IV labetalol religiously being Cleviprex drip tolerated.  Start oral blood pressure medications cleared to swallow.  Mobilize out of bed.  Therapy consults.  Patient.  Long discussion with patient about his intracerebral hemorrhage and evaluation and treatment plan and answered questions. This patient is critically ill and at significant risk of neurological worsening, death and care requires constant monitoring of vital signs, hemodynamics,respiratory and cardiac monitoring, extensive review of multiple databases, frequent neurological assessment, discussion with family, other specialists and medical decision making of high complexity.I have made any additions or clarifications directly to the above note.This critical care time does not reflect procedure time, or teaching time or supervisory time of PA/NP/Med Resident etc but could involve care discussion time.  I spent 30 minutes of neurocritical care time  in the care of  this patient.     Antony Contras, MD Medical Director [facility] Pager: [phone] 06/27/2021 1:43 PM     To contact Stroke Continuity provider, please refer to [email or web address] After hours, contact General Neurology

## 2021-06-27 NOTE — TOC CAGE-AID Note (Signed)
Transition of Care [facility]) - CAGE-AID Screening   Patient Details  Name: Patrick Carter MRN: [id number] Date of Birth: 08/11/1970  Transition of Care [facility]) CM/SW Contact:    Orena Cavazos C Tarpley-Carter, LCSWA Phone Number: 06/27/2021, 9:19 AM   Clinical Narrative: Pt is unable to participate in Cage Aid.  Dario Yono Tarpley-Carter, MSW, LCSW-A Pronouns:  She/Her/Hers [place] Transitions of Care Clinical Social Worker Direct Number:  [phone] [email or web address]  CAGE-AID Screening: Substance Abuse Screening unable to be completed due to: : Patient unable to participate             Substance Abuse Education Offered: No

## 2021-06-27 NOTE — Progress Notes (Signed)
PT Cancellation Note  Patient Details Name: SEUNG SCHULTHEIS MRN: [id number] DOB: [birth year]   Cancelled Treatment:    Reason Eval/Treat Not Completed: Medical issues which prohibited therapy (BP >150) Pt's SBP 155. WIll check back tomorrow.   Lyanne Co, PT  Acute Rehab Services  Pager [phone] Office [phone]    Lawana Chambers Yashika Mask 06/27/2021, 2:41 PM

## 2021-06-27 NOTE — Evaluation (Signed)
Clinical/Bedside Swallow Evaluation Patient Details  Name: Patrick Carter MRN: [id number] Date of Birth: 06-Aug-1970  Today's Date: 06/27/2021 Time: SLP Start Time (ACUTE ONLY): 1000 SLP Stop Time (ACUTE ONLY): 1045 SLP Time Calculation (min) (ACUTE ONLY): 45 min  Past Medical History:  Past Medical History:  Diagnosis Date   Arthritis    Gout    Hypertension    Stroke [facility](HCC)    Past Surgical History: No past surgical history on file. HPI:  51 y.o. 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 ICH score of 1(for GCS of 12). Exam with R sided weakness, ataxia, sensory deficit and extinction. Likely had a lacune that bled. Plan is strict BP control, repeat imaging and stroke workup. Not on AC.    Assessment / Plan / Recommendation Pt kept eyes closed unless cued to open, and cooperative during BSE. Oral motor examination revealed decreased range of motion; possibly secondary to motor planning problem. Patient presented with ice chips and demonstrated adequate lip seal, lateralization, mastication and swallow initiation. SLP progressed to thin liquids, puree, and solid trials; this had no adverse effect on the patient. The pt continued succesfully with oral prepatory phase and pharyngeal phase of swallowing. No overt s/sx of aspiration, coughing, or throat clearing noted throughout the session. It is noted that the patient may require pacing supports to encourage effective bite-and-pull and reduce oral stuffing behaviors. Patient to be placed on Dysphagia 3 (mech soft) diet and thin liquids from a straw/cup. Clinical Impression   SLP Visit Diagnosis: Dysphagia, unspecified (R13.10)    Aspiration Risk  Mild aspiration risk    Diet Recommendation Dysphagia 3 (Mech soft)   Liquid Administration via: Cup;Straw Supervision: Staff to assist with self feeding;Patient able to self feed Compensations: Slow rate;Small sips/bites Postural Changes:  Seated upright at 90 degrees;Remain upright for at least 30 minutes after po intake    Other  Recommendations Oral Care Recommendations: Oral care BID    Recommendations for follow up therapy are one component of a multi-disciplinary discharge planning process, led by the attending physician.  Recommendations may be updated based on patient status, additional functional criteria and insurance authorization.  Follow up Recommendations Acute inpatient rehab (3hours/day)      Assistance Recommended at Discharge PRN  Functional Status Assessment Patient has had a recent decline in their functional status and demonstrates the ability to make significant improvements in function in a reasonable and predictable amount of time.  Frequency and Duration min 2x/week  2 weeks       Prognosis Prognosis for Safe Diet Advancement: Good Barriers to Reach Goals: Cognitive deficits;Language deficits      Swallow Study   General HPI: 51 y.o. 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 ICH score of 1(for GCS of 12). Exam with R sided weakness, ataxia, sensory deficit and extinction. Likely had a lacune that bled. Plan is strict BP control, repeat imaging and stroke workup. Not on AC. Type of Study: Bedside Swallow Evaluation Temperature Spikes Noted: No Respiratory Status: Room air History of Recent Intubation: No Behavior/Cognition: Cooperative;Pleasant mood;Confused;Lethargic/Drowsy;Requires cueing Oral Cavity Assessment: Within Functional Limits Oral Care Completed by SLP: No Oral Cavity - Dentition: Adequate natural dentition Self-Feeding Abilities: Needs assist;Able to feed self Patient Positioning: Upright in bed Baseline Vocal Quality: Normal;Low vocal intensity Volitional Swallow: Able to elicit    Oral/Motor/Sensory Function     Ice Chips Ice chips:  Within functional limits Presentation: Spoon   Thin Liquid Thin Liquid: Within functional  limits Presentation: Cup;Straw;Self Fed    Nectar Thick Nectar Thick Liquid: Not tested   Honey Thick Honey Thick Liquid: Not tested   Puree Puree: Within functional limits Presentation: Spoon   Solid     Solid: Within functional limits Presentation: Self Fed      NIKE 06/27/2021,10:54 AM

## 2021-06-28 LAB — GLUCOSE, CAPILLARY
Glucose-Capillary: 134 mg/dL — ABNORMAL HIGH (ref 70–99)
Glucose-Capillary: 200 mg/dL — ABNORMAL HIGH (ref 70–99)
Glucose-Capillary: 117 mg/dL — ABNORMAL HIGH (ref 70–99)
Glucose-Capillary: 210 mg/dL — ABNORMAL HIGH (ref 70–99)

## 2021-06-28 MED ORDER — PANTOPRAZOLE SODIUM 40 MG PO TBEC
40.0000 mg | DELAYED_RELEASE_TABLET | Freq: Every day | ORAL | Status: DC
  Administered 2021-06-28 – 2021-07-11 (×14): 40 mg via ORAL
  Filled 2021-06-28 (×14): qty 1

## 2021-06-28 NOTE — Progress Notes (Addendum)
STROKE TEAM PROGRESS NOTE   INTERVAL HISTORY Patient neurological exam is stable and unchanged.  He continues to have a eye-opening apraxia and disconjugate gaze and mild right hemiparesis Blood pressure has been controlled on Cleviprex drip.  He appears sleepy with eyes closed but can follow commands quite well.   No complaints.  Cleviprex drip off, hypertonic saline off  Vitals:   06/28/21 1200 06/28/21 1300 06/28/21 1400 06/28/21 1500  BP: (!) 135/99 (!) 118/98 (!) 120/97 (!) 131/102  Pulse: 74 75 78 86  Resp: 18 18 20 18   Temp: 98.5 F (36.9 C)     TempSrc: Oral     SpO2: 100% 100% 100% 97%  Weight:      Height:       CBC:  Recent Labs  Lab 06/26/21 0126 06/26/21 0920  WBC 11.6* 8.0  HGB 14.0 15.9  HCT 43.7 48.3  MCV 87.9 86.1  PLT 227 [id number]   Basic Metabolic Panel:  Recent Labs  Lab 06/26/21 0126 06/26/21 0920  NA 136 136  K 3.6 3.6  CL 104 98  CO2 23 26  GLUCOSE 158* 141*  BUN 13 12  CREATININE 0.76 0.87  CALCIUM 9.1 9.4   Lipid Panel:  Recent Labs  Lab 06/26/21 0920  CHOL 215*  TRIG 77  HDL 51  CHOLHDL 4.2  VLDL 15  LDLCALC 149*   HgbA1c:  Recent Labs  Lab 06/26/21 0920  HGBA1C 11.9*   Urine Drug Screen:  Recent Labs  Lab 06/26/21 0224  LABOPIA NONE DETECTED  COCAINSCRNUR NONE DETECTED  LABBENZ NONE DETECTED  AMPHETMU NONE DETECTED  THCU NONE DETECTED  LABBARB NONE DETECTED    Alcohol Level  Recent Labs  Lab 06/26/21 0140  ETH <10    IMAGING past 24 hours No results found.  PHYSICAL EXAM General:  Alert, thin-appearing male in no acute distress  NEURO:  Mental Status: Eyes are closed has some eye opening apraxia but will follow commands appropriately.  AA&Ox2, slow to respond and will perseverate on different words and phrases at times Speech/Language: speech is hypophonic but without dysarthria or aphasia.    Cranial Nerves:  II: PERRL.  III, IV, VI: Gaze deviated downward, unable to look upwards with upgaze palsy..   Vertical gaze dysconjugate with few beats of vertical nystagmus.  Preserved horizontal eye movements. V: Sensation is intact to light touch and symmetrical to face.  VII: Smile is symmetrical.  VIII: hearing intact to voice. IX, X:  Phonation is normal.  XII: tongue is midline without fasciculations.  Motor: Move RUE and RLE 3/5 with drift noted, LUE and LLE 4/5 strength Sensation- Intact to light touch bilaterally.  Coordination: Ataxia present in left upper extremity Gait- deferred  ASSESSMENT/PLAN [name] is a 51 y.o. 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  Neurosurgery was consulted but did not wish to intervene at this time.  He was then transferred here for further evaluation and treatment.  Hypertonic saline was initially started, has now been discontinued.  Plan to transfer to the medical hospitalist tomorrow.  Transfer orders placed.  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 CT head Acute left thalamic hemorrhage with 15mm left to right midline shift, chronic basal ganglia lacunar infarcts  CTA head & neck Unchanged left thalamic IPH, no LVO or high grade stenosis MRI  left thalamic IPH, stable in size with mild surrounding edema, mild chronic small vessel ischemic changes 2D Echo EF 0000000, grade 1 diastolic dysfunction, cannot exclude small PFO Follow up CT unchanged left thalamic IPH LDL 149 HgbA1c 11.9 VTE prophylaxis - SCDs No antithrombotic prior to admission, now on No antithrombotic secondary to IPH Therapy recommendations: Acute inpatient rehab Disposition:  pending  Hypertension Home meds:  none Stable at this time BP goal 120-140 Long-term BP goal normotensive  Hyperlipidemia Home meds:  none, LDL 149, goal < 70 High intensity statin not indicated at this time due to [place] Start high intensity statin at  discharge  Diabetes type II Uncontrolled Home meds:  metformin 500 mg daily HgbA1c 11.9, goal < 7.0 CBGs Diabetes coordinator consult SSI  Other Stroke Risk Factors Hx stroke   Other Active Problems none  Hospital day # 2  Patient seen and examined by NP/APP with MD. MD to update note as needed.   Janine Ores, DNP, FNP-BC [facility] Pager: [phone] I have personally obtained history,examined this patient, reviewed notes, independently viewed imaging studies, participated in medical decision making and plan of care.ROS completed by me personally and pertinent positives fully documented  I have made any additions or clarifications directly to the above note. Agree with note above.  Continue close neurological monitoring and strict blood pressure control with systolic below 0000000.  Mobilize out of bed.  Ongoing physical occupational therapy.  Transfer to neurology floor bed and hopefully to inpatient rehab in the next few days.  No family available at the bedside for discussion.  We will asked medical hospitalist team to resume care after he leaves ICU.This patient is critically ill and at significant risk of neurological worsening, death and care requires constant monitoring of vital signs, hemodynamics,respiratory and cardiac monitoring, extensive review of multiple databases, frequent neurological assessment, discussion with family, other specialists and medical decision making of high complexity.I have made any additions or clarifications directly to the above note.This critical care time does not reflect procedure time, or teaching time or supervisory time of PA/NP/Med Resident etc but could involve care discussion time.  I spent 30 minutes of neurocritical care time  in the care of  this patient.      Antony Contras, MD Medical Director [facility] Stroke Center Pager: [phone] 06/28/2021 3:49 PM   To contact Stroke Continuity provider, please refer to  [email or web address] After hours, contact General Neurology

## 2021-06-28 NOTE — Evaluation (Signed)
Occupational Therapy Evaluation Patient Details Name: Patrick Carter MRN: [id number] DOB: 09/18/1970 Today's Date: 06/28/2021   History of Present Illness Pt is 51 yo male presented to [facility] after being found down for 2 hours, had L thalamic hemorrhage. PMH: HTN, HLD, DM2 (uncontrolled).   Clinical Impression   Patient admitted with the above diagnosis.  PTA he lives alone, and works full time at a [employer].  He needed no assist with any aspect of ADL/IADL or mobility.  Continues to drive to work.  Deficits impacting independence are listed below.  Currently he is needing up to +2 for mobility and near total assist for lower body ADL at bedlevel.  OT to follow in the acute setting and AIR recommended for post acute rehab prior to returning home.        Recommendations for follow up therapy are one component of a multi-disciplinary discharge planning process, led by the attending physician.  Recommendations may be updated based on patient status, additional functional criteria and insurance authorization.   Follow Up Recommendations  Acute inpatient rehab (3hours/day)    Assistance Recommended at Discharge Frequent or constant Supervision/Assistance  Patient can return home with the following Two people to help with walking and/or transfers;A lot of help with bathing/dressing/bathroom;Assist for transportation;Help with stairs or ramp for entrance;Direct supervision/assist for medications management;Assistance with cooking/housework;Direct supervision/assist for financial management    Functional Status Assessment  Patient has had a recent decline in their functional status and demonstrates the ability to make significant improvements in function in a reasonable and predictable amount of time.  Equipment Recommendations  BSC/3in1;Tub/shower seat    Recommendations for Other Services Rehab consult     Precautions / Restrictions Precautions Precautions: Fall Precaution  Comments: Watch BP Restrictions Weight Bearing Restrictions: No      Mobility Bed Mobility Overal bed mobility: Needs Assistance Bed Mobility: Sidelying to Sit, Sit to Supine   Sidelying to sit: Min assist   Sit to supine: Min guard        Transfers Overall transfer level: Needs assistance   Transfers: Sit to/from Stand Sit to Stand: Min assist, +2 physical assistance           General transfer comment: Mod [facility] no attempts to advance R leg      Balance Overall balance assessment: Needs assistance Sitting-balance support: Bilateral upper extremity supported, Feet unsupported Sitting balance-Leahy Scale: Fair     Standing balance support: Bilateral upper extremity supported Standing balance-Leahy Scale: Poor                             ADL either performed or assessed with clinical judgement   ADL       Grooming: Wash/dry hands;Wash/dry face;Moderate assistance;Bed level   Upper Body Bathing: Moderate assistance;Bed level       Upper Body Dressing : Moderate assistance;Bed level   Lower Body Dressing: Total assistance;Bed level                       Vision Patient Visual Report: Diplopia Vision Assessment?: Yes Ocular Range of Motion: Restricted on the right Tracking/Visual Pursuits: Right eye does not track medially;Decreased smoothness of eye movement to RIGHT superior field;Decreased smoothness of eye movement to RIGHT inferior field Saccades: Additional eye shifts occurred during testing Convergence: Impaired (comment) Diplopia Assessment: Disappears with one eye closed     Perception Perception  Perception: Not tested   Praxis Praxis Praxis: Not tested    Pertinent Vitals/Pain Pain Assessment Pain Assessment: Faces Faces Pain Scale: Hurts little more Pain Location: Headache Pain Intervention(s): Monitored during session     Hand Dominance Right   Extremity/Trunk Assessment Upper  Extremity Assessment Upper Extremity Assessment: RUE deficits/detail RUE Deficits / Details: Inconsistent at times but grossly 2+/5 RUE Sensation: WNL RUE Coordination: decreased gross motor;decreased fine motor   Lower Extremity Assessment Lower Extremity Assessment: Defer to PT evaluation   Cervical / Trunk Assessment Cervical / Trunk Assessment: Normal   Communication Communication Communication: Expressive difficulties   Cognition Arousal/Alertness: Awake/alert Behavior During Therapy: WFL for tasks assessed/performed Overall Cognitive Status: Impaired/Different from baseline Area of Impairment: Orientation, Following commands, Awareness, Attention                 Orientation Level: Place, Situation, Time Current Attention Level: Focused   Following Commands: Follows one step commands with increased time   Awareness: Intellectual         General Comments   BP 139/101 seated.      Exercises     Shoulder Instructions      Home Living Family/patient expects to be discharged to:: Private residence Living Arrangements: Alone Available Help at Discharge: Family;Available 24 hours/day Type of Home: House Home Access: Stairs to enter [employer] of Steps: 3   Home Layout: One level     Bathroom Shower/Tub: Producer, television/film/video: Standard Bathroom Accessibility: Yes How Accessible: Accessible via walker Home Equipment: None          Prior Functioning/Environment Prior Level of Function : Independent/Modified Independent;Working/employed;Driving                        OT Problem List: Decreased strength;Decreased range of motion;Decreased activity tolerance;Decreased coordination;Impaired vision/perception;Impaired balance (sitting and/or standing);Decreased safety awareness;Decreased knowledge of use of DME or AE;Impaired UE functional use;Pain      OT Treatment/Interventions: Self-care/ADL training;Therapeutic  exercise;Balance training;Therapeutic activities;DME and/or AE instruction;Patient/family education;Visual/perceptual remediation/compensation    OT Goals(Current goals can be found in the care plan section) Acute Rehab OT Goals Patient Stated Goal: None stated OT Goal Formulation: Patient unable to participate in goal setting Time For Goal Achievement: 07/12/21 Potential to Achieve Goals: Good ADL Goals Pt Will Perform Grooming: sitting;with min guard assist Pt Will Perform Upper Body Bathing: with min guard assist;sitting Pt Will Perform Upper Body Dressing: with min guard assist;sitting Pt Will Transfer to Toilet: bedside commode;stand pivot transfer;with mod assist  OT Frequency: Min 2X/week    Co-evaluation              AM-PAC OT "6 Clicks" Daily Activity     Outcome Measure Help from another person eating meals?: A Little Help from another person taking care of personal grooming?: A Little Help from another person toileting, which includes using toliet, bedpan, or urinal?: A Lot Help from another person bathing (including washing, rinsing, drying)?: A Lot Help from another person to put on and taking off regular upper body clothing?: A Lot Help from another person to put on and taking off regular lower body clothing?: Total 6 Click Score: 13   End of Session Nurse Communication: Mobility status  Activity Tolerance: Patient limited by lethargy Patient left: in bed;with call bell/phone within reach;with bed alarm set  OT Visit Diagnosis: Unsteadiness on feet (R26.81);Muscle weakness (generalized) (M62.81);Low vision, both eyes (H54.2);Hemiplegia and hemiparesis Hemiplegia - Right/Left: Right Hemiplegia -  dominant/non-dominant: Dominant Hemiplegia - caused by: Nontraumatic intracerebral hemorrhage                Time: 1055-1120 OT Time Calculation (min): 25 min Charges:  OT General Charges $OT Visit: 1 Visit OT Evaluation $OT Eval Moderate Complexity: 1  Mod  06/28/2021  RP, OTR/L  Acute Rehabilitation Services  Office:  [phone]   Suzanna Obey 06/28/2021, 11:30 AM

## 2021-06-28 NOTE — Evaluation (Signed)
Physical Therapy Evaluation Patient Details Name: Patrick Carter MRN: [id number] DOB: [birth year] Today's Date: 06/28/2021  History of Present Illness  51 yo male presented to [facility] on 06/26/21 after being found down for 2 hours, had L thalamic hemorrhage. PMH: HTN, HLD, DM2 (uncontrolled).  Clinical Impression  Patient admitted with above diagnosis. Patient presents with R sided weakness, impaired balance, impaired functional mobility, decreased activity tolerance, impaired communication, and impaired cognition. Patient requires +2 assist for OOB mobility with difficulty advancing R foot without assist. Patient with inconsistencies during strength testing and functional mobility of R LE weakness. Patient will benefit from skilled PT services during acute stay to address listed deficits. Recommend CIR at this time to maximize functional independence and safety.        Recommendations for follow up therapy are one component of a multi-disciplinary discharge planning process, led by the attending physician.  Recommendations may be updated based on patient status, additional functional criteria and insurance authorization.  Follow Up Recommendations Acute inpatient rehab (3hours/day)    Assistance Recommended at Discharge Frequent or constant Supervision/Assistance  Patient can return home with the following  Two people to help with walking and/or transfers;Two people to help with bathing/dressing/bathroom;Assist for transportation;Direct supervision/assist for financial management;Direct supervision/assist for medications management    Equipment Recommendations Other (comment) (TBD)  Recommendations for Other Services  Rehab consult    Functional Status Assessment Patient has had a recent decline in their functional status and demonstrates the ability to make significant improvements in function in a reasonable and predictable amount of time.     Precautions / Restrictions  Precautions Precautions: Fall Precaution Comments: SBP 120-140 Restrictions Weight Bearing Restrictions: No      Mobility  Bed Mobility Overal bed mobility: Needs Assistance Bed Mobility: Supine to Sit, Sit to Supine     Supine to sit: Min assist Sit to supine: Min guard   General bed mobility comments: minA to reposition hips towards EOB '    Transfers Overall transfer level: Needs assistance Equipment used: 2 person hand held assist Transfers: Sit to/from Stand, Bed to chair/wheelchair/BSC Sit to Stand: Min assist, +2 physical assistance   Step pivot transfers: Mod assist, +2 physical assistance, +2 safety/equipment       General transfer comment: minA+2 to stand from EOB. ModA+2 for side steps towards HOB. Assist for advancing R foot towards R side for each step. No initiation of steps by patient    Ambulation/Gait                  Stairs            Wheelchair Mobility    Modified Rankin (Stroke Patients Only) Modified Rankin (Stroke Patients Only) Pre-Morbid Rankin Score: No symptoms Modified Rankin: Severe disability     Balance Overall balance assessment: Needs assistance Sitting-balance support: Bilateral upper extremity supported, Feet unsupported Sitting balance-Leahy Scale: Fair     Standing balance support: Bilateral upper extremity supported Standing balance-Leahy Scale: Poor                               Pertinent Vitals/Pain Pain Assessment Pain Assessment: Faces Faces Pain Scale: Hurts little more Pain Location: Headache Pain Descriptors / Indicators: Headache Pain Intervention(s): Monitored during session    Home Living Family/patient expects to be discharged to:: Private residence Living Arrangements: Alone Available Help at Discharge: Family Type of Home: House Home Access: Stairs to enter   Entrance  Stairs-Number of Steps: 3   Home Layout: One level Home Equipment: None      Prior Function Prior  Level of Function : Independent/Modified Independent;Working/employed;Driving             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 In supine, unable to lift LE off bed but functionally able to lift LE back into bed with no assistance. Grossly 2/5 based on MMT RLE Sensation: WNL RLE Coordination: decreased gross motor;decreased fine motor    Cervical / Trunk Assessment Cervical / Trunk Assessment: Normal  Communication   Communication: Expressive difficulties  Cognition Arousal/Alertness: Lethargic Behavior During Therapy: WFL for tasks assessed/performed Overall Cognitive Status: Impaired/Different from baseline Area of Impairment: Following commands, Awareness, Attention                   Current Attention Level: Focused   Following Commands: Follows one step commands with increased time   Awareness: Intellectual            General Comments General comments (skin integrity, edema, etc.): VSS on RA    Exercises     Assessment/Plan    PT Assessment Patient needs continued PT services  PT Problem List Decreased activity tolerance;Decreased strength;Decreased balance;Decreased coordination;Decreased mobility;Decreased cognition;Decreased safety awareness;Decreased knowledge of use of DME;Decreased knowledge of precautions       PT Treatment Interventions DME instruction;Gait training;Stair training;Functional mobility training;Therapeutic activities;Therapeutic exercise;Balance training;Neuromuscular  re-education;Patient/family education    PT Goals (Current goals can be found in the Care Plan section)  Acute Rehab PT Goals Patient Stated Goal: did not state PT Goal Formulation: With patient Time For Goal Achievement: 07/12/21 Potential to Achieve Goals: Good    Frequency Min 4X/week     Co-evaluation PT/OT/SLP Co-Evaluation/Treatment: Yes Reason for Co-Treatment: Complexity of the patient's impairments (multi-system involvement);For patient/therapist safety;To address functional/ADL transfers PT goals addressed during session: Mobility/safety with mobility;Balance         AM-PAC PT "6 Clicks" Mobility  Outcome Measure Help needed turning from your back to your side while in a flat bed without using bedrails?: A Little Help needed moving from lying on your back to sitting on the side of a flat bed without using bedrails?: A Little Help needed moving to and from a bed to a chair (including a wheelchair)?: Total Help needed standing up from a chair using your arms (e.g., wheelchair or bedside chair)?: Total Help needed to walk in hospital room?: Total Help needed climbing 3-5 steps with a railing? : Total 6 Click Score: 10    End of Session   Activity Tolerance: Patient tolerated treatment well Patient left: in bed;with call bell/phone within reach;with bed alarm set Nurse Communication: Mobility status PT Visit Diagnosis: Unsteadiness on feet (R26.81);Muscle weakness (generalized) (M62.81);Difficulty in walking, not elsewhere classified (R26.2)    Time: [id number] PT Time Calculation (min) (ACUTE ONLY): 19 min   Charges:   PT Evaluation $PT Eval Moderate Complexity: 1 Mod          Mackay Hanauer A. Gilford Rile PT, DPT Acute Rehabilitation Services Pager [phone] Office [phone]   Linna Hoff  06/28/2021, 11:40 AM

## 2021-06-28 NOTE — Progress Notes (Signed)
? ?  Inpatient Rehab Admissions Coordinator : ? ?Per therapy recommendations, patient was screened for CIR candidacy by Makayleigh Poliquin RN MSN.  At this time patient appears to be a potential candidate for CIR. I will place a rehab consult per protocol for full assessment. Please call me with any questions. ? ?Kensie Susman RN MSN ?Admissions Coordinator ?[phone] ?  ?

## 2021-06-29 ENCOUNTER — Inpatient Hospital Stay (HOSPITAL_COMMUNITY): Payer: Self-pay

## 2021-06-29 DIAGNOSIS — M069 Rheumatoid arthritis, unspecified: Secondary | ICD-10-CM

## 2021-06-29 DIAGNOSIS — I161 Hypertensive emergency: Secondary | ICD-10-CM

## 2021-06-29 DIAGNOSIS — I152 Hypertension secondary to endocrine disorders: Secondary | ICD-10-CM

## 2021-06-29 DIAGNOSIS — E1159 Type 2 diabetes mellitus with other circulatory complications: Secondary | ICD-10-CM

## 2021-06-29 LAB — GLUCOSE, CAPILLARY
Glucose-Capillary: 198 mg/dL — ABNORMAL HIGH (ref 70–99)
Glucose-Capillary: 130 mg/dL — ABNORMAL HIGH (ref 70–99)
Glucose-Capillary: 179 mg/dL — ABNORMAL HIGH (ref 70–99)

## 2021-06-29 LAB — CK: Total CK: 32 U/L — ABNORMAL LOW (ref 49–397)

## 2021-06-29 MED ORDER — ATORVASTATIN CALCIUM 10 MG PO TABS
20.0000 mg | ORAL_TABLET | Freq: Every evening | ORAL | Status: DC
  Administered 2021-06-29: 20 mg via ORAL
  Filled 2021-06-29: qty 2

## 2021-06-29 MED ORDER — POLYETHYLENE GLYCOL 3350 17 G PO PACK
17.0000 g | PACK | Freq: Two times a day (BID) | ORAL | Status: DC
  Administered 2021-06-29 – 2021-07-11 (×10): 17 g via ORAL
  Filled 2021-06-29 (×21): qty 1

## 2021-06-29 NOTE — Progress Notes (Signed)
Physical Therapy Treatment Patient Details Name: Patrick Carter MRN: [id number] DOB: Jul 23, 1970 Today's Date: 06/29/2021   History of Present Illness 51 [facility] after being found down for 2 hours, had L thalamic hemorrhage. PMH: HTN, HLD, DM2 (uncontrolled).    PT Comments    Patient progressing towards physical therapy goals. Able initiate gait training this date with modA+2 to take steps with HHAx1. Patient continues to have R sided weakness and mild R inattention requiring cues for sequencing. Patient continues with flat affect and minimal verbalizations. Continue to recommend CIR level therapies to assist with maximizing functional mobility and safety.     Recommendations for follow up therapy are one component of a multi-disciplinary discharge planning process, led by the attending physician.  Recommendations may be updated based on patient status, additional functional criteria and insurance authorization.  Follow Up Recommendations  Acute inpatient rehab (3hours/day)     Assistance Recommended at Discharge Frequent or constant Supervision/Assistance  Patient can return home with the following Two people to help with walking and/or transfers;Two people to help with bathing/dressing/bathroom;Assist for transportation;Direct supervision/assist for financial management;Direct supervision/assist for medications management   Equipment Recommendations  Other (comment) (TBD)    Recommendations for Other Services Rehab consult     Precautions / Restrictions Precautions Precautions: Fall Restrictions Weight Bearing Restrictions: No     Mobility  Bed Mobility Overal bed mobility: Needs Assistance Bed Mobility: Supine to Sit     Supine to sit: Min assist     General bed mobility comments: minA to assist repositioning hips towards EOB    Transfers Overall transfer level: Needs assistance Equipment used: 1 person hand held assist Transfers:  Sit to/from Stand, Bed to chair/wheelchair/BSC Sit to Stand: Min assist, +2 physical assistance   Step pivot transfers: Min assist, +2 physical assistance, +2 safety/equipment       General transfer comment: minA+2 to rise and steady. Able to take steps with R LE this date without assistance. MinA+2 to maintain balance during stepping    Ambulation/Gait Ambulation/Gait assistance: Mod assist, +2 physical assistance Gait Distance (Feet): 4 Feet Assistive device: 1 person hand held assist Gait Pattern/deviations: Step-to pattern, Decreased stride length Gait velocity: decreased     General Gait Details: ModA+2 for balance and support but patient able to take steps forward and backward. Cues required for sequencing and extending R knee to stand upright   Stairs             Wheelchair Mobility    Modified Rankin (Stroke Patients Only) Modified Rankin (Stroke Patients Only) Pre-Morbid Rankin Score: No symptoms Modified Rankin: Severe disability     Balance Overall balance assessment: Needs assistance Sitting-balance support: Bilateral upper extremity supported, Feet unsupported Sitting balance-Leahy Scale: Fair     Standing balance support: Bilateral upper extremity supported Standing balance-Leahy Scale: Poor                              Cognition Arousal/Alertness: Awake/alert Behavior During Therapy: Flat affect Overall Cognitive Status: Impaired/Different from baseline Area of Impairment: Following commands, Awareness, Attention                   Current Attention Level: Sustained   Following Commands: Follows one step commands with increased time   Awareness: Emergent   General Comments: follows commands with increased time. Difficult to fully assess due to minimal verbalizations and not interactive at times  Exercises General Exercises - Lower Extremity Long Arc Quad: Right, 5 reps, Seated    General Comments         Pertinent Vitals/Pain Pain Assessment Pain Assessment: Faces Faces Pain Scale: No hurt Pain Intervention(s): Monitored during session    Home Living                          Prior Function            PT Goals (current goals can now be found in the care plan section) Acute Rehab PT Goals Patient Stated Goal: did not state PT Goal Formulation: With patient Time For Goal Achievement: 07/12/21 Potential to Achieve Goals: Good Progress towards PT goals: Progressing toward goals    Frequency    Min 4X/week      PT Plan Current plan remains appropriate    Co-evaluation              AM-PAC PT "6 Clicks" Mobility   Outcome Measure  Help needed turning from your back to your side while in a flat bed without using bedrails?: A Little Help needed moving from lying on your back to sitting on the side of a flat bed without using bedrails?: A Little Help needed moving to and from a bed to a chair (including a wheelchair)?: Total Help needed standing up from a chair using your arms (e.g., wheelchair or bedside chair)?: Total Help needed to walk in hospital room?: Total Help needed climbing 3-5 steps with a railing? : Total 6 Click Score: 10    End of Session Equipment Utilized During Treatment: Gait belt Activity Tolerance: Patient tolerated treatment well Patient left: in chair;with call bell/phone within reach;with chair alarm set Nurse Communication: Mobility status PT Visit Diagnosis: Unsteadiness on feet (R26.81);Muscle weakness (generalized) (M62.81);Difficulty in walking, not elsewhere classified (R26.2)     Time: 4917-9150 PT Time Calculation (min) (ACUTE ONLY): 25 min  Charges:  $Gait Training: 8-22 mins $Therapeutic Activity: 8-22 mins                     Patrick Carter A. Patrick Carter PT, DPT Acute Rehabilitation Services Pager [phone] Office [phone]    Patrick Carter 06/29/2021, 5:15 PM

## 2021-06-29 NOTE — Progress Notes (Signed)
Speech Language Pathology Treatment: Dysphagia;Cognitive-Linquistic  Patient Details Name: Patrick Carter MRN: [id number] DOB: [birth year] Today's Date: 06/29/2021 Time: 0947-0962 SLP Time Calculation (min) (ACUTE ONLY): 25 min  Assessment / Plan / Recommendation Clinical Impression  Pt received speech services to target unspecified dysphagia and apraxia of speech. Pt observed at novel meal time; presented with mechanical soft breakfast tray. Pt independently consumed ~1/2 of breakfast tray (I.e., scrambled eggs & french toast). No overt s/sx of aspiration, coughing or throat clearing noted throughout the session. Pt independently drank ~4 oz of orange juice via straw cup with no concerns for aspiration. SLP targeted functional confrontational naming tasks with pt; named 4/7 objects in immediate environment correctly. The pt benefited from semantic, phonemic and orthographic cues to improve word-finding accuracy to 100%. Pt. Also demonstrates self-correcting and awareness of wrong naming during task. Pt demonstrated improved awareness and vocalized goals to walk and use right hand with question cues. Pt required moderate verbal cues in order to improve vocal loudness throughout the session. Nurse reported he used loud "yes" after session concluded. In future sessions, SLP to continue functional communicative approaches for patient's apraxia of speech and improve vocal intensity/loudness. No further concerns for dysphagia; pt to remain on mechanical soft diet order to facilitate self-feeding without assistance.     HPI HPI: 51 y.o. 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 ICH score of 1(for GCS of 12). Exam with R sided weakness, ataxia, sensory deficit and extinction. Likely had a lacune that bled. Plan is strict BP control, repeat imaging and stroke workup. Not on AC.      SLP Plan  Continue with current plan of care      Recommendations for  follow up therapy are one component of a multi-disciplinary discharge planning process, led by the attending physician.  Recommendations may be updated based on patient status, additional functional criteria and insurance authorization.    Recommendations  Diet recommendations: Dysphagia 3 (mechanical soft);Thin liquid Liquids provided via: Straw;Cup Medication Administration: Whole meds with liquid Supervision: Patient able to self feed Compensations: Slow rate;Small sips/bites Postural Changes and/or Swallow Maneuvers: Seated upright 90 degrees;Upright 30-60 min after meal                Oral Care Recommendations: Oral care BID Follow Up Recommendations: Home health SLP Assistance recommended at discharge: PRN SLP Visit Diagnosis: Dysphagia, unspecified (R13.10);Apraxia (R48.2) Plan: Continue with current plan of care           [facility]  06/29/2021, 11:42 AM

## 2021-06-29 NOTE — Progress Notes (Addendum)
Inpatient Rehabilitation Admissions Coordinator   I met at bedside with patient . W discussed goals  and expectations of a possible Cir admit pending his progress and caregiver supports. I will contact his nephew to clarify and further discuss caregiver supports.  Danne Baxter, RN, MSN Rehab Admissions Coordinator [phone] 06/29/2021 11:54 AM  I spoke with pt's nephew, Harrell Gave, by phone. He lives in [place], [place]. Pt's son is in [place] and eldest sister in [place]. 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 Nephew to discuss with family to clarify caregiver supports and final disposition plans pending his functional progress. I will follow up with them in a couple of days. He is uninsured and doubtful he can return to previous home.  Danne Baxter, RN, MSN Rehab Admissions Coordinator [phone] 06/29/2021 4:30 PM

## 2021-06-29 NOTE — Progress Notes (Addendum)
STROKE TEAM PROGRESS NOTE   INTERVAL HISTORY Patient neurological exam is improving and patient is now spontaneously able to open his eyes..  There is improvement in eye-opening apraxia but persistent dysconjugate gaze and mild right hemiparesis Blood pressure has been adequately controlled. Vitals:   06/29/21 0900 06/29/21 1000 06/29/21 1100 06/29/21 1200  BP: 105/81 109/84 111/85 116/87  Pulse: 79 80 86 85  Resp: 19 20 17 18   Temp:    98.5 F (36.9 C)  TempSrc:    Oral  SpO2: 97% 95% 98% 96%  Weight:      Height:       CBC:  Recent Labs  Lab 06/26/21 0126 06/26/21 0920  WBC 11.6* 8.0  HGB 14.0 15.9  HCT 43.7 48.3  MCV 87.9 86.1  PLT 227 228   Basic Metabolic Panel:  Recent Labs  Lab 06/26/21 0126 06/26/21 0920  NA 136 136  K 3.6 3.6  CL 104 98  CO2 23 26  GLUCOSE 158* 141*  BUN 13 12  CREATININE 0.76 0.87  CALCIUM 9.1 9.4   Lipid Panel:  Recent Labs  Lab 06/26/21 0920  CHOL 215*  TRIG 77  HDL 51  CHOLHDL 4.2  VLDL 15  LDLCALC 401*   HgbA1c:  Recent Labs  Lab 06/26/21 0920  HGBA1C 11.9*   Urine Drug Screen:  Recent Labs  Lab 06/26/21 0224  LABOPIA NONE DETECTED  COCAINSCRNUR NONE DETECTED  LABBENZ NONE DETECTED  AMPHETMU NONE DETECTED  THCU NONE DETECTED  LABBARB NONE DETECTED    Alcohol Level  Recent Labs  Lab 06/26/21 0140  ETH <10    IMAGING past 24 hours CT HEAD WO CONTRAST ( )  Result Date: 06/29/2021 CLINICAL DATA:  51 year old male with altered mental status presenting with left thalamic hemorrhage on 06/26/2021. Subsequent encounter. EXAM: CT HEAD WITHOUT CONTRAST TECHNIQUE: Contiguous axial images were obtained from the base of the skull through the vertex without intravenous contrast. RADIATION DOSE REDUCTION: This exam was performed according to the departmental dose-optimization program which includes automated exposure control, adjustment of the mA and/or kV according to patient size and/or use of iterative reconstruction  technique. COMPARISON:  Brain MRI 06/26/2021 and earlier. FINDINGS: Brain: Hyperdense intra-axial hemorrhage centered at the left thalamus encompasses 23 x 29 by 35 mm (AP by transverse by CC) for an estimated intra-axial blood volume of 12 mL, versus 10 mL at presentation. Mildly increased regional edema, but stable mild regional mass effect. No intraventricular or extra-axial extension. 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 Stable gray-white matter differentiation outside of the thalamus. No new cortically based infarct. No ventriculomegaly. Normal basilar cisterns. Vascular: No suspicious intracranial vascular hyperdensity. Skull: No acute osseous abnormality identified. Sinuses/Orbits: Visualized paranasal sinuses and mastoids are stable and well aerated. Other: No acute orbit or scalp soft tissue finding. IMPRESSION: 1. Essentially stable left thalamic hemorrhage since presentation, estimated blood volume 10-12 mL. Mildly increased regional edema but stable mild regional mass effect. No extra-axial extension or other complicating features. 2. No new intracranial abnormality. Electronically Signed   By: Odessa Fleming M.D.   On: 06/29/2021 05:41    PHYSICAL EXAM General:  Alert, thin-appearing male in no acute distress  NEURO:  Mental Status: Eyes are now open has only some eye opening apraxia but will follow commands appropriately.  AA&Ox2, slow to respond and will perseverate on different words and phrases at times Speech/Language: speech is hypophonic but without dysarthria or aphasia.    Cranial Nerves:  II:  PERRL.  III, IV, VI: Gaze deviated downward, unable to look upwards with upgaze palsy..  Vertical gaze dysconjugate with few beats of vertical nystagmus.  Preserved horizontal eye movements. V: Sensation is intact to light touch and symmetrical to face.  VII: Smile is symmetrical.  VIII: hearing intact to voice. IX, X:  Phonation is normal.  XII: tongue is midline  without fasciculations.  Motor: Move RUE and RLE 3/5 with drift noted, LUE and LLE 4/5 strength Sensation- Intact to light touch bilaterally.  Coordination: Ataxia present in left upper extremity Gait- deferred  ASSESSMENT/PLAN [name] is a 50 y.o. male with history of DM2, RA, HTN, gout and stroke who presented to [facility] yesterday after being found down for two hours and was found to have a left thalamic hemorrhage.  Neurosurgery was consulted but did not wish to intervene at this time.  He was then transferred here for further evaluation and treatment.  Hypertonic saline was initially started, has now been discontinued.  Plan to transfer to the medical hospitalist tomorrow.  Transfer orders placed.  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 CT head Acute left thalamic hemorrhage with 20mm left to right midline shift, chronic basal ganglia lacunar infarcts  CTA head & neck Unchanged left thalamic IPH, no LVO or high grade stenosis MRI  left thalamic IPH, stable in size with mild surrounding edema, mild chronic small vessel ischemic changes 2D Echo EF 0000000, grade 1 diastolic dysfunction, cannot exclude small PFO Follow up CT unchanged left thalamic IPH LDL 149 HgbA1c 11.9 VTE prophylaxis - SCDs No antithrombotic prior to admission, now on No antithrombotic secondary to [place] Therapy recommendations: Acute inpatient rehab Disposition:  pending  Hypertension Home meds:  none Stable at this time BP goal 120-140 Long-term BP goal normotensive  Hyperlipidemia Home meds:  none, LDL 149, goal < 70 High intensity statin not indicated at this time due to [place] Start high intensity statin at discharge  Diabetes type II Uncontrolled Home meds:  metformin 500 mg daily HgbA1c 11.9, goal < 7.0 CBGs Diabetes coordinator consult SSI  Other Stroke Risk Factors Hx stroke   Other Active Problems none  Hospital day # 3  Patient has remained stable from neurological  exam as well as neuroimaging.  Continue close neurological monitoring and strict blood pressure control with systolic below 0000000.  Mobilize out of bed.  Ongoing physical and occupational therapy.  Transfer to neurology floor bed and hopefully to inpatient rehab in the next few days.  No family available at the bedside for discussion.  Appreciate medical hospitalist team to resume care .  Discussed with Dr Gwynneth Albright..This patient is critically ill and at significant risk of neurological worsening, death and care requires constant monitoring of vital signs, hemodynamics,respiratory and cardiac monitoring, extensive review of multiple databases, frequent neurological assessment, discussion with family, other specialists and medical decision making of high complexity.I have made any additions or clarifications directly to the above note.This critical care time does not reflect procedure time, or teaching time or supervisory time of PA/NP/Med Resident etc but could involve care discussion time.  I spent 30 minutes of neurocritical care time  in the care of  this patient.      Antony Contras, MD Medical Director [facility] Pager: [phone] 06/29/2021 2:05 PM   To contact Stroke Continuity provider, please refer to [email or web address] After hours, contact General Neurology

## 2021-06-29 NOTE — Progress Notes (Signed)
PROGRESS NOTE    DENY ANZUALDA  [id number] DOB: [birth year] DOA: 06/26/2021 PCP: Patient, No Pcp Per (Inactive)    Brief Narrative:  HPI: [name] is a 51 yo male with a PMHx of DM II and RA on methotrexate and prednisone (not taking), HTN, gout, and prior CVA without sequelae, who arrived at [facility] via EMS after being found down x 2 hours per EMS. A neurology tele code stroke consult was done at OSH with findings of right sided weakness and confusion. CTH showed Left thalamic hemorrhage with 28mm left to right midline shift and chronic bilateral basal ganglia lacunar infarcts.  CTA showed no LVO or high grade stenosis of intracranial arteries. NSU was called with no suggestion for intervention at this time.    Glucose 158 on arrival. UDS negative. He remained sleepy and confused at OSH. He arrived at [facility] and was admitted to [place] ICU.    1/19 [place] pickup from stroke team    Consultants:  Neurology  Procedures:   Antimicrobials:      Subjective: Patient has no complaints.  Reports currently he is not hungry to eat his lunch.  No headaches, dizziness nausea vomiting  Objective: Vitals:   06/29/21 0900 06/29/21 1000 06/29/21 1100 06/29/21 1200  BP: 105/81 109/84 111/85 116/87  Pulse: 79 80 86 85  Resp: 19 20 17 18   Temp:      TempSrc:      SpO2: 97% 95% 98% 96%  Weight:      Height:        Intake/Output Summary (Last 24 hours) at 06/29/2021 1354 Last data filed at 06/29/2021 0400 Gross per 24 hour  Intake 240 ml  Output 1325 ml  Net -1085 ml   Filed Weights   06/26/21 0700 06/27/21 0500  Weight: 60.1 kg 54.8 kg    Examination:  General exam: Appears calm and comfortable  Respiratory system: Clear to auscultation. Respiratory effort normal. Cardiovascular system: S1 & S2 heard, RRR. No JVD, murmurs, rubs, gallops or clicks Gastrointestinal system: Abdomen is nondistended, soft and nontender. Normal bowel sounds heard. Central nervous system: Alert and  awake.  Slow to respond.  Right upper extremity and lower extremity 3 out of 5.  Left upper extremity left lower extremity4-5/5 Extremities: No edema Psychiatry:  Mood & affect appropriate.     Data Reviewed: I have personally reviewed following labs and imaging studies  CBC: Recent Labs  Lab 06/26/21 0126 06/26/21 0920  WBC 11.6* 8.0  HGB 14.0 15.9  HCT 43.7 48.3  MCV 87.9 86.1  PLT 227 [id number]   Basic Metabolic Panel: Recent Labs  Lab 06/26/21 0126 06/26/21 0920  NA 136 136  K 3.6 3.6  CL 104 98  CO2 23 26  GLUCOSE 158* 141*  BUN 13 12  CREATININE 0.76 0.87  CALCIUM 9.1 9.4   GFR: Estimated Creatinine Clearance: 77.9 mL/min (by C-G formula based on SCr of 0.87 mg/dL). Liver Function Tests: Recent Labs  Lab 06/26/21 0126 06/26/21 0920  AST 23 23  ALT 16 16  ALKPHOS 78 81  BILITOT 0.9 0.7  PROT 7.6 8.0  ALBUMIN 4.1 4.0   No results for input(s): LIPASE, AMYLASE in the last 168 hours. Recent Labs  Lab 06/26/21 0142  AMMONIA 26   Coagulation Profile: Recent Labs  Lab 06/26/21 0211 06/26/21 0920  INR 1.0 0.9   Cardiac Enzymes: No results for input(s): CKTOTAL, CKMB, CKMBINDEX, TROPONINI in the last 168 hours. BNP (last 3 results) No  results for input(s): PROBNP in the last 8760 hours. HbA1C: No results for input(s): HGBA1C in the last 72 hours. CBG: Recent Labs  Lab 06/28/21 1137 06/28/21 1709 06/28/21 2212 06/29/21 0730 06/29/21 1154  GLUCAP 200* 117* 210* 198* 130*   Lipid Profile: No results for input(s): CHOL, HDL, LDLCALC, TRIG, CHOLHDL, LDLDIRECT in the last 72 hours. Thyroid Function Tests: No results for input(s): TSH, T4TOTAL, FREET4, T3FREE, THYROIDAB in the last 72 hours. Anemia Panel: No results for input(s): VITAMINB12, FOLATE, FERRITIN, TIBC, IRON, RETICCTPCT in the last 72 hours. Sepsis Labs: No results for input(s): PROCALCITON, LATICACIDVEN in the last 168 hours.  Recent Results (from the past 240 hour(s))  Resp Panel by  RT-PCR (Flu A&B, Covid) Nasopharyngeal Swab     Status: None   Collection Time: 06/26/21  2:11 AM   Specimen: Nasopharyngeal Swab; Nasopharyngeal(NP) swabs in vial transport medium  Result Value Ref Range Status   SARS Coronavirus 2 by RT PCR NEGATIVE NEGATIVE Final    Comment: (NOTE) SARS-CoV-2 target nucleic acids are NOT DETECTED.  The SARS-CoV-2 RNA is generally detectable in upper respiratory specimens during the acute phase of infection. The lowest concentration of SARS-CoV-2 viral copies this assay can detect is 138 copies/mL. A negative result does not preclude SARS-Cov-2 infection and should not be used as the sole basis for treatment or other patient management decisions. A negative result may occur with  improper specimen collection/handling, submission of specimen other than nasopharyngeal swab, presence of viral mutation(s) within the areas targeted by this assay, and inadequate number of viral copies(<138 copies/mL). A negative result must be combined with clinical observations, patient history, and epidemiological information. The expected result is Negative.  Fact Sheet for Patients:  EntrepreneurPulse.com.au  Fact Sheet for Healthcare Providers:  IncredibleEmployment.be  This test is no t yet approved or cleared by the [place] FDA and  has been authorized for detection and/or diagnosis of SARS-CoV-2 by FDA under an Emergency Use Authorization (EUA). This EUA will remain  in effect (meaning this test can be used) for the duration of the COVID-19 declaration under Section 564(b)(1) of the Act, 21 U.S.C.section 360bbb-3(b)(1), unless the authorization is terminated  or revoked sooner.       Influenza A by PCR NEGATIVE NEGATIVE Final   Influenza B by PCR NEGATIVE NEGATIVE Final    Comment: (NOTE) The Xpert Xpress SARS-CoV-2/FLU/RSV plus assay is intended as an aid in the diagnosis of influenza from Nasopharyngeal swab  specimens and should not be used as a sole basis for treatment. Nasal washings and aspirates are unacceptable for Xpert Xpress SARS-CoV-2/FLU/RSV testing.  Fact Sheet for Patients: EntrepreneurPulse.com.au  Fact Sheet for Healthcare Providers: IncredibleEmployment.be  This test is not yet approved or cleared by the [place] FDA and has been authorized for detection and/or diagnosis of SARS-CoV-2 by FDA under an Emergency Use Authorization (EUA). This EUA will remain in effect (meaning this test can be used) for the duration of the COVID-19 declaration under Section 564(b)(1) of the Act, 21 U.S.C. section 360bbb-3(b)(1), unless the authorization is terminated or revoked.  Performed at [facility], [address] [place], [place] [address]   MRSA Next Gen by PCR, Nasal     Status: None   Collection Time: 06/26/21  7:00 AM   Specimen: Nasal Mucosa; Nasal Swab  Result Value Ref Range Status   MRSA by PCR Next Gen NOT DETECTED NOT DETECTED Final    Comment: (NOTE) The GeneXpert MRSA Assay (FDA approved for  NASAL specimens only), is one component of a comprehensive MRSA colonization surveillance program. It is not intended to diagnose MRSA infection nor to guide or monitor treatment for MRSA infections. Test performance is not FDA approved in patients less than 68 years old. Performed at [facility], [place] [address], [place], [place] [address]          Radiology Studies: CT HEAD WO CONTRAST (5MM)  Result Date: 06/29/2021 CLINICAL DATA:  51 year old male with altered mental status presenting with left thalamic hemorrhage on 06/26/2021. Subsequent encounter. EXAM: CT HEAD WITHOUT CONTRAST TECHNIQUE: Contiguous axial images were obtained from the base of the skull through the vertex without intravenous contrast. RADIATION DOSE REDUCTION: This exam was performed according to the departmental dose-optimization program which  includes automated exposure control, adjustment of the mA and/or kV according to patient size and/or use of iterative reconstruction technique. COMPARISON:  Brain MRI 06/26/2021 and earlier. FINDINGS: Brain: Hyperdense intra-axial hemorrhage centered at the left thalamus encompasses 23 x 29 by 35 mm (AP by transverse by CC) for an estimated intra-axial blood volume of 12 mL, versus 10 mL at presentation. Mildly increased regional edema, but stable mild regional mass effect. No intraventricular or extra-axial extension. 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 Stable gray-white matter differentiation outside of the thalamus. No new cortically based infarct. No ventriculomegaly. Normal basilar cisterns. Vascular: No suspicious intracranial vascular hyperdensity. Skull: No acute osseous abnormality identified. Sinuses/Orbits: Visualized paranasal sinuses and mastoids are stable and well aerated. Other: No acute orbit or scalp soft tissue finding. IMPRESSION: 1. Essentially stable left thalamic hemorrhage since presentation, estimated blood volume 10-12 mL. Mildly increased regional edema but stable mild regional mass effect. No extra-axial extension or other complicating features. 2. No new intracranial abnormality. Electronically Signed   By: Genevie Ann M.D.   On: 06/29/2021 05:41        Scheduled Meds:  amLODipine  5 mg Oral Daily   Chlorhexidine Gluconate Cloth  6 each Topical Daily   folic acid  1 mg Oral Daily   insulin aspart  0-15 Units Subcutaneous TID WC   methotrexate  15 mg Oral Weekly   pantoprazole  40 mg Oral QHS   polyethylene glycol  17 g Oral BID   senna-docusate  1 tablet Oral BID   Continuous Infusions:  Assessment & Plan:   Principal Problem:   ICH (intracerebral hemorrhage) ([place]) Active Problems:   Hypertension complicating diabetes ([place])   Hypertensive emergency   Rheumatoid arthritis [facility])   Mr. DEMIAN VOGELE is a 51 y.o. male with history of  DM2, RA, HTN, gout and stroke who presented to [facility] yesterday after being found down for two hours and was found to have a left thalamic hemorrhage.  Neurosurgery was consulted but did not wish to intervene at this time.  He was then transferred here for further evaluation and treatment.  Hypertonic saline was initially started, has now been discontinued.  Plan to transfer to the medical hospitalist tomorrow.  Transfer orders placed.   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 reviewed.  Notified neurology of results 2. Plan for acute inpatient rehab -pending  Hypertension Stable Strict blood pressure control with systolic less than 0000000   Hyperlipidemia LDL elevated, goal less than 70 Will start Lipitor 20 every afternoon     Diabetes type II Uncontrolled Home meds:  metformin 500 mg daily HgbA1c 11.9, goal < 7.0 1/19 BG stable.  Diabetic educator following Continue R-ISS   DVT prophylaxis: SCD Code Status: Full Family Communication: None at bedside Disposition Plan: CIR Status is: Inpatient  Remains inpatient appropriate because: Unsafe discharge.  Needs to be cleared by neurology for discharge.  CIR pending            LOS: 3 days   Time spent: 35 minutes with more than 50% on [place], MD [facility] Pager [phone]  If 7PM-7AM, please contact  night-coverage 06/29/2021, 1:54 PM

## 2021-06-30 DIAGNOSIS — I639 Cerebral infarction, unspecified: Secondary | ICD-10-CM

## 2021-06-30 LAB — GLUCOSE, CAPILLARY
Glucose-Capillary: 167 mg/dL — ABNORMAL HIGH (ref 70–99)
Glucose-Capillary: 191 mg/dL — ABNORMAL HIGH (ref 70–99)
Glucose-Capillary: 178 mg/dL — ABNORMAL HIGH (ref 70–99)
Glucose-Capillary: 142 mg/dL — ABNORMAL HIGH (ref 70–99)

## 2021-06-30 MED ORDER — ATORVASTATIN CALCIUM 40 MG PO TABS
40.0000 mg | ORAL_TABLET | Freq: Every evening | ORAL | Status: DC
  Administered 2021-06-30 – 2021-07-11 (×12): 40 mg via ORAL
  Filled 2021-06-30 (×12): qty 1

## 2021-06-30 NOTE — Progress Notes (Addendum)
Inpatient Diabetes Program Recommendations  AACE/ADA: New Consensus Statement on Inpatient Glycemic Control (2015)  Target Ranges:  Prepandial:   less than 140 mg/dL      Peak postprandial:   less than 180 mg/dL (1-2 hours)      Critically ill patients:  140 - 180 mg/dL   Lab Results  Component Value Date   GLUCAP 191 (H) 06/30/2021   HGBA1C 11.9 (H) 06/26/2021    Review of Glycemic Control  Diabetes history: type 2 Outpatient Diabetes medications: Metformin 500 mg at breakfast Current orders for Inpatient glycemic control: Novolog MODERATE correction scale TID   Inpatient Diabetes Program Recommendations:   Received diabetes coordinator consult. Noted that patient has HgbA1C of 11.9% and has been on Metformin.  Noted that patient does not have insurance and will probably not be able to go back home. Depending on how the patient is eating (question as to eating extra carbohydrates at home) and the plan for discharge, patient might benefit from small amount of NPH or affordable basal insulin or different oral agents to help bring his A1C down.   With his mental status now, will continue to follow while in the hospital.Will follow plans for discharge when available.   Smith Mince RN BSN CDE Diabetes Coordinator Pager: [phone]  8am-5pm

## 2021-06-30 NOTE — Progress Notes (Signed)
Physical Therapy Treatment Patient Details Name: Patrick Carter MRN: [id number] DOB: Jun 10, 1971 Today's Date: 06/30/2021   History of Present Illness 51 yo male presented to [facility] on 06/26/21 after being found down for 2 hours, had L thalamic hemorrhage. PMH: HTN, HLD, DM2 (uncontrolled).    PT Comments    Pt received in supine, agreeable to therapy session and with good participation and tolerance for transfer and gait training at bedside. Pt able to progress gait distance using RW with manual assist for RLE advancement and Rt knee block at times to prevent buckling. Pt at times with difficulty gripping RW handle with RUE. Pt c/o diplopia and related dizziness, BP stable seated EOB. Pt continues to benefit from PT services to progress toward functional mobility goals. Pt remains a high fall risk but is making good progress toward goals and would highly benefit from high intensity post-acute rehab to return to independence.   Recommendations for follow up therapy are one component of a multi-disciplinary discharge planning process, led by the attending physician.  Recommendations may be updated based on patient status, additional functional criteria and insurance authorization.  Follow Up Recommendations  Acute inpatient rehab (3hours/day)     Assistance Recommended at Discharge Frequent or constant Supervision/Assistance  Patient can return home with the following Two people to help with walking and/or transfers;Two people to help with bathing/dressing/bathroom;Assist for transportation;Direct supervision/assist for financial management;Direct supervision/assist for medications management;Help with stairs or ramp for entrance   Equipment Recommendations  Other (comment) (TBD, currently RW and BSC)    Recommendations for Other Services Rehab consult     Precautions / Restrictions Precautions Precautions: Fall Precaution Comments: SBP 120-140 Restrictions Weight Bearing Restrictions:  No     Mobility  Bed Mobility Overal bed mobility: Needs Assistance Bed Mobility: Supine to Sit     Supine to sit: Min assist     General bed mobility comments: assistance with trunk, pt with good initation for BLE mobility, increased time to perform    Transfers Overall transfer level: Needs assistance Equipment used: Rolling walker (2 wheels), 2 person hand held assist Transfers: Sit to/from Stand, Bed to chair/wheelchair/BSC Sit to Stand: Min assist, +2 physical assistance   Step pivot transfers: Min assist, +2 physical assistance, +2 safety/equipment       General transfer comment: patient had difficulty taking steps with hand held and improved with use of RW. RUE occasionally had difficulty gripping walker. Rt lean, does better with tactile cues/Rt knee blocking for LLE steps    Ambulation/Gait Ambulation/Gait assistance: Mod assist, +2 physical assistance Gait Distance (Feet): 12 Feet (54ft lateral/forward steps, seated break, 71ft, seated break, 6ft) Assistive device: Rolling walker (2 wheels), 2 person hand held assist Gait Pattern/deviations: Step-to pattern, Decreased stride length, Decreased step length - left, Decreased step length - right, Decreased stance time - right, Knees buckling Gait velocity: decreased     General Gait Details: pt initally with RLE buckling with bilateral HHA so used RW for 2nd/3rd trials with improved upright posture and manual assist for RLE, pt with decreased buckling on 3rd trial with tactile cues for Rt quad activation in stance phase   Stairs             Wheelchair Mobility    Modified Rankin (Stroke Patients Only) Modified Rankin (Stroke Patients Only) Pre-Morbid Rankin Score: No symptoms Modified Rankin: Moderately severe disability     Balance Overall balance assessment: Needs assistance Sitting-balance support: Bilateral upper extremity supported, Feet unsupported Sitting balance-Leahy Scale:  Fair Sitting  balance - Comments: able to sit on EOB with supervision to min guard   Standing balance support: Bilateral upper extremity supported Standing balance-Leahy Scale: Poor Standing balance comment: reliant on UE support while standing, RW and external assist; Rt lean                            Cognition Arousal/Alertness: Awake/alert Behavior During Therapy: Flat affect Overall Cognitive Status: Impaired/Different from baseline Area of Impairment: Following commands, Awareness, Attention                 Orientation Level: Place, Situation, Time Current Attention Level: Sustained   Following Commands: Follows one step commands with increased time   Awareness: Emergent   General Comments: increased time to follow commands.  Difficulty keeping eyes open due to dizziness and complaints of double vision, pt only able to briefly try gaze stabilization for improved sx.        Exercises General Exercises - Lower Extremity Long Arc Quad: AROM, Right, 10 reps, Seated Hip Flexion/Marching: AROM, Both, 10 reps, Seated    General Comments General comments (skin integrity, edema, etc.): BP stable 120/90 seated EOB after standing (pt c/o dizziness due to double vision), HR 94-100 bpm seated      Pertinent Vitals/Pain Pain Assessment Pain Assessment: No/denies pain Faces Pain Scale: No hurt     PT Goals (current goals can now be found in the care plan section) Acute Rehab PT Goals Patient Stated Goal: did not state PT Goal Formulation: With patient Time For Goal Achievement: 07/12/21 Progress towards PT goals: Progressing toward goals    Frequency    Min 4X/week      PT Plan Current plan remains appropriate    Co-evaluation PT/OT/SLP Co-Evaluation/Treatment: Yes Reason for Co-Treatment: For patient/therapist safety;To address functional/ADL transfers PT goals addressed during session: Mobility/safety with mobility;Balance;Proper use of  DME;Strengthening/ROM OT goals addressed during session: ADL's and self-care      AM-PAC PT "6 Clicks" Mobility   Outcome Measure  Help needed turning from your back to your side while in a flat bed without using bedrails?: A Little Help needed moving from lying on your back to sitting on the side of a flat bed without using bedrails?: A Little Help needed moving to and from a bed to a chair (including a wheelchair)?: A Lot Help needed standing up from a chair using your arms (e.g., wheelchair or bedside chair)?: A Lot (mod cues) Help needed to walk in hospital room?: Total (<27ft) Help needed climbing 3-5 steps with a railing? : Total 6 Click Score: 12    End of Session Equipment Utilized During Treatment: Gait belt Activity Tolerance: Patient tolerated treatment well Patient left: in chair;with call bell/phone within reach;with chair alarm set Nurse Communication: Mobility status PT Visit Diagnosis: Unsteadiness on feet (R26.81);Muscle weakness (generalized) (M62.81);Difficulty in walking, not elsewhere classified (R26.2)     Time: [id number] PT Time Calculation (min) (ACUTE ONLY): 28 min  Charges:  $Gait Training: 8-22 mins                     Kennet Mccort P., PTA Acute Rehabilitation Services Pager: [phone] Office: [place] 06/30/2021, 12:14 PM

## 2021-06-30 NOTE — Progress Notes (Signed)
Occupational Therapy Treatment Patient Details Name: Patrick Carter MRN: [id number] DOB: Sep 22, 1970 Today's Date: 06/30/2021   History of present illness 51 yo male presented to [facility] on 06/26/21 after being found down for 2 hours, had L thalamic hemorrhage. PMH: HTN, HLD, DM2 (uncontrolled).   OT comments  Focus of session to further assess complaints of diplopia. Pt complains of horizontal diplopia. Appears to be L eye dominant. Nasal portion of R eye occluded to improve functional vision and reduce complaints of diplopia. Pt states he wears glasses - asked pt to have his family bring in his glasses. Continue to recommend rehab at [facility]. OT to continue to follow.    Recommendations for follow up therapy are one component of a multi-disciplinary discharge planning process, led by the attending physician.  Recommendations may be updated based on patient status, additional functional criteria and insurance authorization.    Follow Up Recommendations  Acute inpatient rehab (3hours/day)    Assistance Recommended at Discharge Frequent or constant Supervision/Assistance  Patient can return home with the following  Two people to help with walking and/or transfers;A lot of help with bathing/dressing/bathroom;Assist for transportation;Help with stairs or ramp for entrance;Direct supervision/assist for medications management;Assistance with cooking/housework;Direct supervision/assist for financial management   Equipment Recommendations  BSC/3in1;Tub/shower seat    Recommendations for Other Services Rehab consult    Precautions / Restrictions Precautions Precautions: Fall Precaution Comments: SBP 120-140 Restrictions Weight Bearing Restrictions: No       Mobility Bed Mobility               General bed mobility comments: OOB in chair    Transfers                         Balance                                           ADL either performed or  assessed with clinical judgement   ADL Overall ADL's : Needs assistance/impaired                               General ADL Comments: session focused on assessing vision    Extremity/Trunk Assessment          Vision   Vision Assessment?: Yes Eye Alignment: Impaired (comment) Ocular Range of Motion: Restricted on the right Tracking/Visual Pursuits: Impaired - to be further tested in functional context Diplopia Assessment: Objects split side to side;Disappears with one eye closed Depth Perception: Undershoots Additional Comments: complaints of double vision   Perception     Praxis      Cognition Arousal/Alertness: Awake/alert Behavior During Therapy: Flat affect Overall Cognitive Status: Impaired/Different from baseline Area of Impairment: Following commands, Awareness, Attention                 Orientation Level: Place, Situation, Time Current Attention Level: Sustained   Following Commands: Follows one step commands with increased time   Awareness: Emergent   General Comments: increased time to follow commands.       Exercises      Shoulder Instructions       General Comments Seen to further assess diplopia and assess with use of partial occlusion    Pertinent Vitals/ Pain       Pain Assessment Pain Assessment: Faces Faces  Pain Scale: Hurts a little bit Pain Location: head Pain Descriptors / Indicators: Headache Pain Intervention(s): Limited activity within patient's tolerance  Home Living                                          Prior Functioning/Environment              Frequency  Min 2X/week        Progress Toward Goals  OT Goals(current goals can now be found in the care plan section)  Progress towards OT goals: Progressing toward goals  Acute Rehab OT Goals Patient Stated Goal: to get better OT Goal Formulation: With patient Time For Goal Achievement: 07/12/21 Potential to Achieve Goals:  Good ADL Goals Pt Will Perform Grooming: sitting;with min guard assist Pt Will Perform Upper Body Bathing: with min guard assist;sitting Pt Will Perform Upper Body Dressing: with min guard assist;sitting Pt Will Transfer to Toilet: bedside commode;stand pivot transfer;with mod assist Additional ADL Goal #1: Pt will demonstrate use of partial occlusion with min VC to reduce symptoms of diplopia and improve functional vision  Plan Discharge plan remains appropriate    Co-evaluation       AM-PAC OT "6 Clicks" Daily Activity     Outcome Measure   Help from another person eating meals?: A Little Help from another person taking care of personal grooming?: A Little Help from another person toileting, which includes using toliet, bedpan, or urinal?: A Lot Help from another person bathing (including washing, rinsing, drying)?: A Lot Help from another person to put on and taking off regular upper body clothing?: A Lot Help from another person to put on and taking off regular lower body clothing?: Total 6 Click Score: 13    End of Session Equipment Utilized During Treatment: Gait belt;Rolling walker (2 wheels)  OT Visit Diagnosis: Unsteadiness on feet (R26.81);Muscle weakness (generalized) (M62.81);Low vision, both eyes (H54.2);Hemiplegia and hemiparesis Hemiplegia - Right/Left: Right Hemiplegia - dominant/non-dominant: Dominant Hemiplegia - caused by: Nontraumatic intracerebral hemorrhage   Activity Tolerance Patient tolerated treatment well   Patient Left in chair;with call bell/phone within reach;with chair alarm set   Nurse Communication Other (comment) (use of partial occlsuion)        Time: 1254-1310 OT Time Calculation (min): 16 min  Charges: OT General Charges $OT Visit: 1 Visit OT Treatments $Therapeutic Activity: 8-22 mins  Luisa Dago, OT/L   Acute OT Clinical Specialist Acute Rehabilitation Services Pager [phone] Office [phone]    [facility] 06/30/2021, 2:00 PM

## 2021-06-30 NOTE — Progress Notes (Signed)
Occupational Therapy Treatment Patient Details Name: Patrick Carter MRN: [id number] DOB: 08-27-1970 Today's Date: 06/30/2021   History of present illness 51 yo male presented to [facility] on 06/26/21 after being found down for 2 hours, had L thalamic hemorrhage. PMH: HTN, HLD, DM2 (uncontrolled).   OT comments  Patient was co-treat with PT to address functional transfers. Patient received in bed and was min assist to get to EOB. Patient required gown change and required mod assist due to difficulty using RUE.  Patient stood with hand held assist +2 and had difficulty taking steps.  Progressed to RW and patient had less difficulty but continues to require assistance with RLE and RUE had difficulty with gripping walker.  Patient had complaints of dizziness and double vision but decreased by end of session. Acute OT to continue to follow.    Recommendations for follow up therapy are one component of a multi-disciplinary discharge planning process, led by the attending physician.  Recommendations may be updated based on patient status, additional functional criteria and insurance authorization.    Follow Up Recommendations  Acute inpatient rehab (3hours/day)    Assistance Recommended at Discharge Frequent or constant Supervision/Assistance  Patient can return home with the following  Two people to help with walking and/or transfers;A lot of help with bathing/dressing/bathroom;Assist for transportation;Help with stairs or ramp for entrance;Direct supervision/assist for medications management;Assistance with cooking/housework;Direct supervision/assist for financial management   Equipment Recommendations  BSC/3in1;Tub/shower seat    Recommendations for Other Services      Precautions / Restrictions Precautions Precautions: Fall Restrictions Weight Bearing Restrictions: No       Mobility Bed Mobility Overal bed mobility: Needs Assistance Bed Mobility: Supine to Sit     Supine to sit: Min  assist     General bed mobility comments: assistance with trunk    Transfers Overall transfer level: Needs assistance Equipment used: Rolling walker (2 wheels), 2 person hand held assist Transfers: Sit to/from Stand, Bed to chair/wheelchair/BSC Sit to Stand: Min assist, +2 physical assistance     Step pivot transfers: Min assist, +2 physical assistance, +2 safety/equipment     General transfer comment: patient had difficulty taking steps with hand held and improved with use of RW. RUE occasionally had difficulty gripping walker     Balance Overall balance assessment: Needs assistance Sitting-balance support: Bilateral upper extremity supported, Feet unsupported Sitting balance-Leahy Scale: Fair Sitting balance - Comments: able to sit on EOB with supervision   Standing balance support: Bilateral upper extremity supported Standing balance-Leahy Scale: Poor Standing balance comment: reliant on UE support while standing                           ADL either performed or assessed with clinical judgement   ADL Overall ADL's : Needs assistance/impaired                 Upper Body Dressing : Moderate assistance;Sitting Upper Body Dressing Details (indicate cue type and reason): changed gown sitting on EOB                   General ADL Comments: difficulty using RUE for dressing    Extremity/Trunk Assessment Upper Extremity Assessment RUE Deficits / Details: Inconsistent at times but grossly 2+/5 RUE Sensation: WNL RUE Coordination: decreased gross motor;decreased fine motor            Vision   Ocular Range of Motion: Restricted on the right Additional Comments: complaints of  double vision   Perception     Praxis      Cognition Arousal/Alertness: Awake/alert Behavior During Therapy: Flat affect Overall Cognitive Status: Impaired/Different from baseline Area of Impairment: Following commands, Awareness, Attention                  Orientation Level: Place, Situation, Time Current Attention Level: Sustained   Following Commands: Follows one step commands with increased time   Awareness: Emergent   General Comments: increased time to follow commands.  Difficulty keeping eyes open due to dizziness and complaints of double vision        Exercises      Shoulder Instructions       General Comments      Pertinent Vitals/ Pain       Pain Assessment Faces Pain Scale: No hurt  Home Living                                          Prior Functioning/Environment              Frequency  Min 2X/week        Progress Toward Goals  OT Goals(current goals can now be found in the care plan section)  Progress towards OT goals: Progressing toward goals  Acute Rehab OT Goals OT Goal Formulation: Patient unable to participate in goal setting Time For Goal Achievement: 07/12/21 Potential to Achieve Goals: Good ADL Goals Pt Will Perform Grooming: sitting;with min guard assist Pt Will Perform Upper Body Bathing: with min guard assist;sitting Pt Will Perform Upper Body Dressing: with min guard assist;sitting Pt Will Transfer to Toilet: bedside commode;stand pivot transfer;with mod assist  Plan Discharge plan remains appropriate    Co-evaluation    PT/OT/SLP Co-Evaluation/Treatment: Yes Reason for Co-Treatment: For patient/therapist safety;To address functional/ADL transfers   OT goals addressed during session: ADL's and self-care      AM-PAC OT "6 Clicks" Daily Activity     Outcome Measure   Help from another person eating meals?: A Little Help from another person taking care of personal grooming?: A Little Help from another person toileting, which includes using toliet, bedpan, or urinal?: A Lot Help from another person bathing (including washing, rinsing, drying)?: A Lot Help from another person to put on and taking off regular upper body clothing?: A Lot Help from another  person to put on and taking off regular lower body clothing?: Total 6 Click Score: 13    End of Session Equipment Utilized During Treatment: Gait belt;Rolling walker (2 wheels)  OT Visit Diagnosis: Unsteadiness on feet (R26.81);Muscle weakness (generalized) (M62.81);Low vision, both eyes (H54.2);Hemiplegia and hemiparesis Hemiplegia - Right/Left: Right Hemiplegia - dominant/non-dominant: Dominant Hemiplegia - caused by: Nontraumatic intracerebral hemorrhage   Activity Tolerance Patient tolerated treatment well   Patient Left in chair;with call bell/phone within reach;with chair alarm set   Nurse Communication Mobility status        Time: [id number] OT Time Calculation (min): 28 min  Charges: OT General Charges $OT Visit: 1 Visit OT Treatments $Therapeutic Activity: 8-22 mins  Alfonse Flavorsick Deagan Sevin, OTA Acute Rehabilitation Services  Pager [phone] Office [phone]   Dewain PenningRickie L Whitleigh Garramone 06/30/2021, 11:14 AM

## 2021-06-30 NOTE — Progress Notes (Signed)
Occupational Therapy Treatment Patient Details Name: Patrick Carter MRN: [id number] DOB: 09/16/1970 Today's Date: 06/30/2021   History of present illness 51 yo male presented to [facility] on 06/26/21 after being found down for 2 hours, had L thalamic hemorrhage. PMH: HTN, HLD, DM2 (uncontrolled).   OT comments  Pt received 3rd OT treatment session today due to RN needing assist with ADL's and transfer back to bed. Pt continues to require min A +2 for all functional mobility and increased assist on the R side for support with weakness and incoordination. OT checked on occlusion glasses and pt reports that he feels that they are helping his vision. Continue to recommend AIR and OT will continue to follow acutely.    Recommendations for follow up therapy are one component of a multi-disciplinary discharge planning process, led by the attending physician.  Recommendations may be updated based on patient status, additional functional criteria and insurance authorization.    Follow Up Recommendations  Acute inpatient rehab (3hours/day)    Assistance Recommended at Discharge Frequent or constant Supervision/Assistance  Patient can return home with the following  Two people to help with walking and/or transfers;A lot of help with bathing/dressing/bathroom;Assist for transportation;Help with stairs or ramp for entrance;Direct supervision/assist for medications management;Assistance with cooking/housework;Direct supervision/assist for financial management   Equipment Recommendations  BSC/3in1;Tub/shower seat    Recommendations for Other Services Rehab consult    Precautions / Restrictions Precautions Precautions: Fall Restrictions Weight Bearing Restrictions: No       Mobility Bed Mobility Overal bed mobility: Needs Assistance Bed Mobility: Sit to Supine       Sit to supine: Min assist   General bed mobility comments: Min A to bring BLE into bed    Transfers Overall transfer level:  Needs assistance Equipment used: 2 person hand held assist Transfers: Sit to/from Stand, Bed to chair/wheelchair/BSC Sit to Stand: Min assist, +2 physical assistance     Step pivot transfers: Min assist, +2 physical assistance, +2 safety/equipment     General transfer comment: Pt having difficulty with steps to the R side     Balance Overall balance assessment: Needs assistance Sitting-balance support: Bilateral upper extremity supported, Feet unsupported Sitting balance-Leahy Scale: Fair Sitting balance - Comments: able to sit on EOB with supervision to min guard   Standing balance support: Bilateral upper extremity supported Standing balance-Leahy Scale: Poor Standing balance comment: reliant on UE support while standing, RW and external assist; Rt lean                           ADL either performed or assessed with clinical judgement   ADL Overall ADL's : Needs assistance/impaired     Grooming: Wash/dry face;Wash/dry hands;Sitting;Set up Grooming Details (indicate cue type and reason): completed in recliner                 Toilet Transfer: Minimal assistance;+2 for physical assistance;+2 for safety/equipment Toilet Transfer Details (indicate cue type and reason): assisted by RN sarah to return pt to bed, having difficulty with RLE with pivot           General ADL Comments: Session focused on transfers per RN request    Extremity/Trunk Assessment              Vision   Vision Assessment?: Vision impaired- to be further tested in functional context   Perception     Praxis      Cognition Arousal/Alertness: Awake/alert Behavior During Therapy:  Flat affect Overall Cognitive Status: Impaired/Different from baseline Area of Impairment: Following commands, Awareness, Attention                 Orientation Level: Place, Situation, Time Current Attention Level: Sustained   Following Commands: Follows one step commands with increased  time   Awareness: Emergent   General Comments: Increased time to follow commands, reports that occluded glasses are assisting with decreased double vision        Exercises      Shoulder Instructions       General Comments Pt reporting that partial occlusion of glasses is helping his double vision    Pertinent Vitals/ Pain       Pain Assessment Pain Assessment: No/denies pain  Home Living                                          Prior Functioning/Environment              Frequency  Min 2X/week        Progress Toward Goals  OT Goals(current goals can now be found in the care plan section)  Progress towards OT goals: Progressing toward goals  Acute Rehab OT Goals Patient Stated Goal: to have vision improve OT Goal Formulation: With patient Time For Goal Achievement: 07/12/21 Potential to Achieve Goals: Good ADL Goals Pt Will Perform Grooming: sitting;with min guard assist Pt Will Perform Upper Body Bathing: with min guard assist;sitting Pt Will Perform Upper Body Dressing: with min guard assist;sitting Pt Will Transfer to Toilet: bedside commode;stand pivot transfer;with mod assist Additional ADL Goal #1: Pt will demonstrate use of partial occlusion with min VC to reduce symptoms of diplopia and improve functional vision  Plan Discharge plan remains appropriate    Co-evaluation                 AM-PAC OT "6 Clicks" Daily Activity     Outcome Measure   Help from another person eating meals?: A Little Help from another person taking care of personal grooming?: A Little Help from another person toileting, which includes using toliet, bedpan, or urinal?: A Lot Help from another person bathing (including washing, rinsing, drying)?: A Lot Help from another person to put on and taking off regular upper body clothing?: A Lot Help from another person to put on and taking off regular lower body clothing?: Total 6 Click Score: 13    End  of Session Equipment Utilized During Treatment: Gait belt  OT Visit Diagnosis: Unsteadiness on feet (R26.81);Muscle weakness (generalized) (M62.81);Low vision, both eyes (H54.2);Hemiplegia and hemiparesis Hemiplegia - Right/Left: Right Hemiplegia - dominant/non-dominant: Dominant Hemiplegia - caused by: Nontraumatic intracerebral hemorrhage   Activity Tolerance Patient tolerated treatment well   Patient Left in bed;with call bell/phone within reach;with bed alarm set   Nurse Communication Mobility status (RN assisting with transfers)        Time: [id number] OT Time Calculation (min): 10 min  Charges: OT General Charges $OT Visit: 1 Visit OT Treatments $Therapeutic Activity: 8-22 mins  Analiah Drum H., OTR/L Acute Rehabilitation  Ellie Bryand Elane Yolanda Bonine 06/30/2021, 7:45 PM

## 2021-06-30 NOTE — Progress Notes (Signed)
PROGRESS NOTE    HAMDAN BANTER  [id number] DOB: 02-10-1971 DOA: 06/26/2021 PCP: Patient, No Pcp Per (Inactive)    Brief Narrative:  HPI: Mr. Kestutis Thormahlen is a 51 yo male with a PMHx of DM II and RA on methotrexate and prednisone (not taking), HTN, gout, and prior CVA without sequelae, who arrived at [facility] via EMS after being found down x 2 hours per EMS. A neurology tele code stroke consult was done at OSH with findings of right sided weakness and confusion. CTH showed Left thalamic hemorrhage with 68mm left to right midline shift and chronic bilateral basal ganglia lacunar infarcts.  CTA showed no LVO or high grade stenosis of intracranial arteries. NSU was called with no suggestion for intervention at this time.    Glucose 158 on arrival. UDS negative. He remained sleepy and confused at OSH. He arrived at [facility] and was admitted to 4N ICU.    1/19 [place] pickup from stroke team  1/20 no overnight issues    Consultants:  Neurology  Procedures:   Antimicrobials:      Subjective: Talks slowly. Open and closes eyes during exam. Aware of where he is and after much thinking knows date, but not person. Has no sob, HA, cp  Objective: Vitals:   06/30/21 0028 06/30/21 0404 06/30/21 0835 06/30/21 1250  BP: 107/83 113/84 118/84 111/82  Pulse: 93 85 94 98  Resp: 17 17 18 17   Temp: 98.2 F (36.8 C) 98.9 F (37.2 C) 98.7 F (37.1 C) 98.4 F (36.9 C)  TempSrc: Oral Oral Oral Oral  SpO2: 100% 97% 100% 99%  Weight:      Height:        Intake/Output Summary (Last 24 hours) at 06/30/2021 1401 Last data filed at 06/29/2021 1700 Gross per 24 hour  Intake --  Output 500 ml  Net -500 ml   Filed Weights   06/26/21 0700 06/27/21 0500  Weight: 60.1 kg 54.8 kg    Examination: NAD, calm, eating lunch Cta no w/r Reg s1/s2 no gallop Soft bening +bs No edema Aaoxo2, not to person. Talks slowly with brief pauses. No droop 5/5 LT ex. 2/5 RU, 3/5RL    Data Reviewed: I have  personally reviewed following labs and imaging studies  CBC: Recent Labs  Lab 06/26/21 0126 06/26/21 0920  WBC 11.6* 8.0  HGB 14.0 15.9  HCT 43.7 48.3  MCV 87.9 86.1  PLT 227 [id number]   Basic Metabolic Panel: Recent Labs  Lab 06/26/21 0126 06/26/21 0920  NA 136 136  K 3.6 3.6  CL 104 98  CO2 23 26  GLUCOSE 158* 141*  BUN 13 12  CREATININE 0.76 0.87  CALCIUM 9.1 9.4   GFR: Estimated Creatinine Clearance: 77.9 mL/min (by C-G formula based on SCr of 0.87 mg/dL). Liver Function Tests: Recent Labs  Lab 06/26/21 0126 06/26/21 0920  AST 23 23  ALT 16 16  ALKPHOS 78 81  BILITOT 0.9 0.7  PROT 7.6 8.0  ALBUMIN 4.1 4.0   No results for input(s): LIPASE, AMYLASE in the last 168 hours. Recent Labs  Lab 06/26/21 0142  AMMONIA 26   Coagulation Profile: Recent Labs  Lab 06/26/21 0211 06/26/21 0920  INR 1.0 0.9   Cardiac Enzymes: Recent Labs  Lab 06/29/21 1625  CKTOTAL 32*   BNP (last 3 results) No results for input(s): PROBNP in the last 8760 hours. HbA1C: No results for input(s): HGBA1C in the last 72 hours. CBG: Recent Labs  Lab 06/29/21 0730  06/29/21 1154 06/29/21 1636 06/30/21 0655 06/30/21 1248  GLUCAP 198* 130* 179* 167* 191*   Lipid Profile: No results for input(s): CHOL, HDL, LDLCALC, TRIG, CHOLHDL, LDLDIRECT in the last 72 hours. Thyroid Function Tests: No results for input(s): TSH, T4TOTAL, FREET4, T3FREE, THYROIDAB in the last 72 hours. Anemia Panel: No results for input(s): VITAMINB12, FOLATE, FERRITIN, TIBC, IRON, RETICCTPCT in the last 72 hours. Sepsis Labs: No results for input(s): PROCALCITON, LATICACIDVEN in the last 168 hours.  Recent Results (from the past 240 hour(s))  Resp Panel by RT-PCR (Flu A&B, Covid) Nasopharyngeal Swab     Status: None   Collection Time: 06/26/21  2:11 AM   Specimen: Nasopharyngeal Swab; Nasopharyngeal(NP) swabs in vial transport medium  Result Value Ref Range Status   SARS Coronavirus 2 by RT PCR NEGATIVE  NEGATIVE Final    Comment: (NOTE) SARS-CoV-2 target nucleic acids are NOT DETECTED.  The SARS-CoV-2 RNA is generally detectable in upper respiratory specimens during the acute phase of infection. The lowest concentration of SARS-CoV-2 viral copies this assay can detect is 138 copies/mL. A negative result does not preclude SARS-Cov-2 infection and should not be used as the sole basis for treatment or other patient management decisions. A negative result may occur with  improper specimen collection/handling, submission of specimen other than nasopharyngeal swab, presence of viral mutation(s) within the areas targeted by this assay, and inadequate number of viral copies(<138 copies/mL). A negative result must be combined with clinical observations, patient history, and epidemiological information. The expected result is Negative.  Fact Sheet for Patients:  EntrepreneurPulse.com.au  Fact Sheet for Healthcare Providers:  IncredibleEmployment.be  This test is no t yet approved or cleared by the [place] FDA and  has been authorized for detection and/or diagnosis of SARS-CoV-2 by FDA under an Emergency Use Authorization (EUA). This EUA will remain  in effect (meaning this test can be used) for the duration of the COVID-19 declaration under Section 564(b)(1) of the Act, 21 U.S.C.section 360bbb-3(b)(1), unless the authorization is terminated  or revoked sooner.       Influenza A by PCR NEGATIVE NEGATIVE Final   Influenza B by PCR NEGATIVE NEGATIVE Final    Comment: (NOTE) The Xpert Xpress SARS-CoV-2/FLU/RSV plus assay is intended as an aid in the diagnosis of influenza from Nasopharyngeal swab specimens and should not be used as a sole basis for treatment. Nasal washings and aspirates are unacceptable for Xpert Xpress SARS-CoV-2/FLU/RSV testing.  Fact Sheet for Patients: EntrepreneurPulse.com.au  Fact Sheet for Healthcare  Providers: IncredibleEmployment.be  This test is not yet approved or cleared by the [place] FDA and has been authorized for detection and/or diagnosis of SARS-CoV-2 by FDA under an Emergency Use Authorization (EUA). This EUA will remain in effect (meaning this test can be used) for the duration of the COVID-19 declaration under Section 564(b)(1) of the Act, 21 U.S.C. section 360bbb-3(b)(1), unless the authorization is terminated or revoked.  Performed at [facility], [address] [place], [place] [address]   MRSA Next Gen by PCR, Nasal     Status: None   Collection Time: 06/26/21  7:00 AM   Specimen: Nasal Mucosa; Nasal Swab  Result Value Ref Range Status   MRSA by PCR Next Gen NOT DETECTED NOT DETECTED Final    Comment: (NOTE) The GeneXpert MRSA Assay (FDA approved for NASAL specimens only), is one component of a comprehensive MRSA colonization surveillance program. It is not intended to diagnose MRSA infection nor to guide or monitor treatment  for MRSA infections. Test performance is not FDA approved in patients less than 73 years old. Performed at [facility], [place] [address] [place], [place] [address]          Radiology Studies: CT HEAD WO CONTRAST (5MM)  Result Date: 06/29/2021 CLINICAL DATA:  51 year old male with altered mental status presenting with left thalamic hemorrhage on 06/26/2021. Subsequent encounter. EXAM: CT HEAD WITHOUT CONTRAST TECHNIQUE: Contiguous axial images were obtained from the base of the skull through the vertex without intravenous contrast. RADIATION DOSE REDUCTION: This exam was performed according to the departmental dose-optimization program which includes automated exposure control, adjustment of the mA and/or kV according to patient size and/or use of iterative reconstruction technique. COMPARISON:  Brain MRI 06/26/2021 and earlier. FINDINGS: Brain: Hyperdense intra-axial hemorrhage centered at the  left thalamus encompasses 23 x 29 by 35 mm (AP by transverse by CC) for an estimated intra-axial blood volume of 12 mL, versus 10 mL at presentation. Mildly increased regional edema, but stable mild regional mass effect. No intraventricular or extra-axial extension. 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 Stable gray-white matter differentiation outside of the thalamus. No new cortically based infarct. No ventriculomegaly. Normal basilar cisterns. Vascular: No suspicious intracranial vascular hyperdensity. Skull: No acute osseous abnormality identified. Sinuses/Orbits: Visualized paranasal sinuses and mastoids are stable and well aerated. Other: No acute orbit or scalp soft tissue finding. IMPRESSION: 1. Essentially stable left thalamic hemorrhage since presentation, estimated blood volume 10-12 mL. Mildly increased regional edema but stable mild regional mass effect. No extra-axial extension or other complicating features. 2. No new intracranial abnormality. Electronically Signed   By: Genevie Ann M.D.   On: 06/29/2021 05:41        Scheduled Meds:  amLODipine  5 mg Oral Daily   atorvastatin  20 mg Oral QPM   Chlorhexidine Gluconate Cloth  6 each Topical Daily   folic acid  1 mg Oral Daily   insulin aspart  0-15 Units Subcutaneous TID WC   methotrexate  15 mg Oral Weekly   pantoprazole  40 mg Oral QHS   polyethylene glycol  17 g Oral BID   senna-docusate  1 tablet Oral BID   Continuous Infusions:  Assessment & Plan:   Principal Problem:   ICH (intracerebral hemorrhage) ([place]) Active Problems:   Hypertension complicating diabetes ([place])   Hypertensive emergency   Rheumatoid arthritis [facility])   Mr. LOYCE NEMBHARD is a 51 y.o. male with history of DM2, RA, HTN, gout and stroke who presented to [facility] yesterday after being found down for two hours and was found to have a left thalamic hemorrhage.  Neurosurgery was consulted but did not wish to intervene at this time.  He  was then transferred here for further evaluation and treatment.  Hypertonic saline was initially started, has now been discontinued.  Plan to transfer to the medical hospitalist tomorrow.  Transfer orders placed.   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 CT head Acute left thalamic hemorrhage with 48mm left to right midline shift, chronic basal ganglia lacunar infarcts  CTA head & neck Unchanged left thalamic IPH, no LVO or high grade stenosis MRI  left thalamic IPH, stable in size with mild surrounding edema, mild chronic small vessel ischemic changes 2D Echo EF 0000000, grade 1 diastolic dysfunction, cannot exclude small PFO Follow up CT unchanged left thalamic IPH No antithrombotic prior to admission, now on No antithrombotic secondary to IPH Goal LDL less than 70 Goal  A1c less than 7.0  Repeat CT of the head this a.m. reviewed.  Notified neurology of results . 1/20 start Lipitor 40 mg nightly  Baseline CK 32  Plan for acute inpatient rehab pending  Continue neuro checks           Hypertension BP stable  Strict blood pressure control with systolic less than 0000000      Hyperlipidemia LDL elevated, goal less than 70 1/20 change Lipitor to 40 mg nightly        Diabetes type II Uncontrolled Home meds:  metformin 500 mg daily HgbA1c 11.9, goal < 7.0 1/20 diabetes uncontrolled  BG here stable  Continue RISS     DVT prophylaxis: SCD Code Status: Full Family Communication: None at bedside Disposition Plan: CIR Status is: Inpatient  Remains inpatient appropriate because: Unsafe discharge.  Needs to be cleared by neurology for discharge.  CIR pending            LOS: 4 days   Time spent: 35 minutes with more than 50% on [place], MD [facility] Pager [phone]  If 7PM-7AM, please contact night-coverage 06/30/2021, 2:01 PM

## 2021-06-30 NOTE — Progress Notes (Signed)
STROKE TEAM PROGRESS NOTE   INTERVAL HISTORY  Patient continues to do well.  He complains of diplopia and his eyes are open.  He has some improvement in his vertical gaze right eye is hypertropic eyes are dysconjugate in the vertical gaze.  He is awaiting rehab transfer.  No new complaints.  Vital signs stable. Vitals:   06/30/21 0028 06/30/21 0404 06/30/21 0835 06/30/21 1250  BP: 107/83 113/84 118/84 111/82  Pulse: 93 85 94 98  Resp: 17 17 18 17   Temp: 98.2 F (36.8 C) 98.9 F (37.2 C) 98.7 F (37.1 C) 98.4 F (36.9 C)  TempSrc: Oral Oral Oral Oral  SpO2: 100% 97% 100% 99%  Weight:      Height:       CBC:  Recent Labs  Lab 06/26/21 0126 06/26/21 0920  WBC 11.6* 8.0  HGB 14.0 15.9  HCT 43.7 48.3  MCV 87.9 86.1  PLT 227 [id number]   Basic Metabolic Panel:  Recent Labs  Lab 06/26/21 0126 06/26/21 0920  NA 136 136  K 3.6 3.6  CL 104 98  CO2 23 26  GLUCOSE 158* 141*  BUN 13 12  CREATININE 0.76 0.87  CALCIUM 9.1 9.4   Lipid Panel:  Recent Labs  Lab 06/26/21 0920  CHOL 215*  TRIG 77  HDL 51  CHOLHDL 4.2  VLDL 15  LDLCALC 149*   HgbA1c:  Recent Labs  Lab 06/26/21 0920  HGBA1C 11.9*   Urine Drug Screen:  Recent Labs  Lab 06/26/21 0224  LABOPIA NONE DETECTED  COCAINSCRNUR NONE DETECTED  LABBENZ NONE DETECTED  AMPHETMU NONE DETECTED  THCU NONE DETECTED  LABBARB NONE DETECTED    Alcohol Level  Recent Labs  Lab 06/26/21 0140  ETH <10    IMAGING past 24 hours No results found.  PHYSICAL EXAM General:  Alert, thin-appearing male in no acute distress  NEURO:  Mental Status: Eyes are now open has only some eye opening apraxia but will follow commands appropriately.  AA&Ox2, slow to respond and will perseverate on different words and phrases at times Speech/Language: speech is hypophonic but without dysarthria or aphasia.    Cranial Nerves:  II: PERRL.  III, IV, VI: Gaze deviated downward,  able to look upwards partially with the right eye but not  the left...  Vertical gaze dysconjugate with few beats of vertical nystagmus.  Preserved horizontal eye movements. V: Sensation is intact to light touch and symmetrical to face.  VII: Smile is symmetrical.  VIII: hearing intact to voice. IX, X:  Phonation is normal.  XII: tongue is midline without fasciculations.  Motor: Move RUE and RLE 3/5 with drift noted, LUE and LLE 4/5 strength Sensation- Intact to light touch bilaterally.  Coordination: Ataxia present in left upper extremity Gait- deferred  ASSESSMENT/PLAN Mr. GAGAN MCELHONE is a 51 y.o. male with history of DM2, RA, HTN, gout and stroke who presented to [facility] yesterday after being found down for two hours and was found to have a left thalamic hemorrhage.  Neurosurgery was consulted but did not wish to intervene at this time.  He was then transferred here for further evaluation and treatment.  Hypertonic saline was initially started, has now been discontinued.  Plan to transfer to the medical hospitalist tomorrow.  Transfer orders placed.  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 CT head Acute left thalamic hemorrhage with 76mm left to right midline shift, chronic basal ganglia lacunar infarcts  CTA head & neck Unchanged left  thalamic IPH, no LVO or high grade stenosis MRI  left thalamic IPH, stable in size with mild surrounding edema, mild chronic small vessel ischemic changes 2D Echo EF 0000000, grade 1 diastolic dysfunction, cannot exclude small PFO Follow up CT unchanged left thalamic IPH LDL 149 HgbA1c 11.9 VTE prophylaxis - SCDs No antithrombotic prior to admission, now on No antithrombotic secondary to [place] Therapy recommendations: Acute inpatient rehab Disposition:  pending  Hypertension Home meds:  none Stable at this time BP goal 120-140 Long-term BP goal normotensive  Hyperlipidemia Home meds:  none, LDL 149, goal < 70 High intensity statin not indicated at this time due to [place] Start high intensity  statin at discharge  Diabetes type II Uncontrolled Home meds:  metformin 500 mg daily HgbA1c 11.9, goal < 7.0 CBGs Diabetes coordinator consult SSI  Other Stroke Risk Factors Hx stroke   Other Active Problems none  Hospital day # 4  Patient is showing some neurological improvement.  Recommend eye patch for diplopia.  Strict blood pressure control with systolic below 0000000.  Mobilize out of bed.  Ongoing physical and occupational therapy.  Transfer hopefully to inpatient rehab in the next few days.  No family available at the bedside for discussion.   Discussed with Dr Gwynneth Albright.  Greater than 50% time during this 35-minute visit was spent in counseling coordination of care discussion with patient and therapist and care team and answering questions.  Stroke team will sign off.  Follow-up as an outpatient stroke clinic in 2 months      Antony Contras, [place] Pager: [phone] 06/30/2021 2:14 PM   To contact Stroke Continuity provider, please refer to [email or web address] After hours, contact General Neurology

## 2021-07-01 LAB — GLUCOSE, CAPILLARY
Glucose-Capillary: 152 mg/dL — ABNORMAL HIGH (ref 70–99)
Glucose-Capillary: 148 mg/dL — ABNORMAL HIGH (ref 70–99)
Glucose-Capillary: 238 mg/dL — ABNORMAL HIGH (ref 70–99)
Glucose-Capillary: 191 mg/dL — ABNORMAL HIGH (ref 70–99)

## 2021-07-01 NOTE — Progress Notes (Signed)
Occupational Therapy Treatment Patient Details Name: Patrick Carter MRN: [id number] DOB: July 14, 1970 Today's Date: 07/01/2021   History of present illness 51 yo male [facility] after being found down for 2 hours, had L thalamic hemorrhage. PMH: HTN, HLD, DM2 (uncontrolled).   OT comments  Pt making progress with OT goals. This session, pt worked on self grooming at the sink, as well as progressing his gait. Pt continues to lean to the R in standing, requiring min support throughout. This session, pt appears at times frustrated with deficits, however unable to verbalize well what he is thinking/frustrated with. Continue recommending AIR to maximize pt independence prior to returning home. OT will continue following up.     Recommendations for follow up therapy are one component of a multi-disciplinary discharge planning process, led by the attending physician.  Recommendations may be updated based on patient status, additional functional criteria and insurance authorization.    Follow Up Recommendations  Acute inpatient rehab (3hours/day)    Assistance Recommended at Discharge Frequent or constant Supervision/Assistance  Patient can return home with the following  Two people to help with walking and/or transfers;A lot of help with bathing/dressing/bathroom;Assist for transportation;Help with stairs or ramp for entrance;Direct supervision/assist for medications management;Assistance with cooking/housework;Direct supervision/assist for financial management   Equipment Recommendations  BSC/3in1;Tub/shower seat    Recommendations for Other Services Rehab consult    Precautions / Restrictions Precautions Precautions: Fall Precaution Comments: SBP 120-140 Restrictions Weight Bearing Restrictions: No       Mobility Bed Mobility Overal bed mobility: Needs Assistance Bed Mobility: Supine to Sit       Sit to supine: Min assist   General bed mobility comments:  increased time and very effortful, needs cues for technique and bed rail for pulling trunk toward EOB; pt not using RUE during transfer    Transfers Overall transfer level: Needs assistance Equipment used: Rolling walker (2 wheels) Transfers: Sit to/from Stand, Bed to chair/wheelchair/BSC Sit to Stand: Min assist, +2 physical assistance           General transfer comment: Rt knee blocked upon standing, pt having difficulty placing RUE on RW handle needs min guard to maintain initially     Balance Overall balance assessment: Needs assistance Sitting-balance support: Bilateral upper extremity supported, Feet unsupported Sitting balance-Leahy Scale: Fair Sitting balance - Comments: able to sit on EOB with supervision to min guard   Standing balance support: Bilateral upper extremity supported Standing balance-Leahy Scale: Poor Standing balance comment: reliant on UE support while standing, RW and external assist; Rt lean with static standing but able to perform task standing at sink with intermittent minA for balance when not using UE for support                           ADL either performed or assessed with clinical judgement   ADL Overall ADL's : Needs assistance/impaired Eating/Feeding: Set up;Sitting Eating/Feeding Details (indicate cue type and reason): All containers had to be opened for pt. Left self feeding at end of session Grooming: Wash/dry face;Wash/dry hands;Oral care;Minimal assistance;Standing Grooming Details (indicate cue type and reason): completed standing at sink, needed assist with opening toothbrush package, leaning to R side throughout, requiring min Support for balance                 Toilet Transfer: Minimal assistance;+2 for physical assistance;+2 for safety/equipment;Ambulation Toilet Transfer Details (indicate cue type and reason): Able to ambulate to  the bathroom with min A +2 for gait assist, balance and proper RW usage            General ADL Comments: Pt with decreased activity tolerance this session, requiring increased time and support    Extremity/Trunk Assessment              Vision   Additional Comments: Pt continues to report dizziness and double vision, reports that occluded glasses help some   Perception     Praxis      Cognition Arousal/Alertness: Awake/alert Behavior During Therapy: Flat affect Overall Cognitive Status: Impaired/Different from baseline Area of Impairment: Following commands, Awareness, Attention                 Orientation Level: Place, Situation, Time Current Attention Level: Sustained   Following Commands: Follows one step commands with increased time   Awareness: Emergent   General Comments: Increased time to follow commands, reports that occluded glasses are assisting with decreased double vision but does report dizziness still; very soft voice        Exercises      Shoulder Instructions       General Comments      Pertinent Vitals/ Pain       Pain Assessment Pain Assessment: Faces Faces Pain Scale: Hurts even more Pain Location: generalized body aches Pain Descriptors / Indicators: Aching, Discomfort Pain Intervention(s): Monitored during session, Premedicated before session, Repositioned  Home Living                                          Prior Functioning/Environment              Frequency  Min 2X/week        Progress Toward Goals  OT Goals(current goals can now be found in the care plan section)  Progress towards OT goals: Progressing toward goals  Acute Rehab OT Goals Patient Stated Goal: none stated this session OT Goal Formulation: With patient Time For Goal Achievement: 07/12/21 Potential to Achieve Goals: Good ADL Goals Pt Will Perform Grooming: sitting;with min guard assist Pt Will Perform Upper Body Bathing: with min guard assist;sitting Pt Will Perform Upper Body Dressing: with min guard  assist;sitting Pt Will Transfer to Toilet: bedside commode;stand pivot transfer;with mod assist Additional ADL Goal #1: Pt will demonstrate use of partial occlusion with min VC to reduce symptoms of diplopia and improve functional vision  Plan Discharge plan remains appropriate;Frequency remains appropriate    Co-evaluation    PT/OT/SLP Co-Evaluation/Treatment: Yes Reason for Co-Treatment: For patient/therapist safety;To address functional/ADL transfers PT goals addressed during session: Mobility/safety with mobility;Balance;Proper use of DME;Strengthening/ROM OT goals addressed during session: ADL's and self-care;Strengthening/ROM      AM-PAC OT "6 Clicks" Daily Activity     Outcome Measure   Help from another person eating meals?: A Little Help from another person taking care of personal grooming?: A Little Help from another person toileting, which includes using toliet, bedpan, or urinal?: A Lot Help from another person bathing (including washing, rinsing, drying)?: A Lot Help from another person to put on and taking off regular upper body clothing?: A Lot Help from another person to put on and taking off regular lower body clothing?: A Lot 6 Click Score: 14    End of Session Equipment Utilized During Treatment: Gait belt;Rolling walker (2 wheels)  OT Visit Diagnosis: Unsteadiness on feet (R26.81);Muscle  weakness (generalized) (M62.81);Low vision, both eyes (H54.2);Hemiplegia and hemiparesis Hemiplegia - Right/Left: Right Hemiplegia - dominant/non-dominant: Dominant Hemiplegia - caused by: Nontraumatic intracerebral hemorrhage   Activity Tolerance Patient tolerated treatment well   Patient Left in chair;with call bell/phone within reach;with chair alarm set   Nurse Communication Mobility status        Time: 1236-1300 OT Time Calculation (min): 24 min  Charges: OT General Charges $OT Visit: 1 Visit OT Treatments $Self Care/Home Management : 8-22 mins  Patrick Sadowski H.,  OTR/L Acute Rehabilitation  Patrick Carter 07/01/2021, 1:49 PM

## 2021-07-01 NOTE — Progress Notes (Signed)
Physical Therapy Treatment Patient Details Name: Patrick Carter MRN: [id number] DOB: 1970-10-29 Today's Date: 07/01/2021   History of Present Illness 51 yo male presented to [facility] on 06/26/21 after being found down for 2 hours, had L thalamic hemorrhage. PMH: HTN, HLD, DM2 (uncontrolled).    PT Comments    Pt received in supine, c/o generalized aches but agreeable to therapy session with encouragement. Pt with good participation and improving tolerance for standing functional mobility tasks and progress gait distance >60ft with RW and up to +2 modA. Pt benefits from multimodal cues for step sequencing and RW use and to prevent RLE buckling. Pt continues to benefit from PT services to progress toward functional mobility goals.    Recommendations for follow up therapy are one component of a multi-disciplinary discharge planning process, led by the attending physician.  Recommendations may be updated based on patient status, additional functional criteria and insurance authorization.  Follow Up Recommendations  Acute inpatient rehab (3hours/day)     Assistance Recommended at Discharge Frequent or constant Supervision/Assistance  Patient can return home with the following Two people to help with walking and/or transfers;Two people to help with bathing/dressing/bathroom;Assist for transportation;Direct supervision/assist for financial management;Direct supervision/assist for medications management;Help with stairs or ramp for entrance;Assistance with cooking/housework   Equipment Recommendations  Other (comment) (TBD, currently RW and BSC)    Recommendations for Other Services Rehab consult     Precautions / Restrictions Precautions Precautions: Fall Precaution Comments: SBP 120-140 Restrictions Weight Bearing Restrictions: No     Mobility  Bed Mobility Overal bed mobility: Needs Assistance Bed Mobility: Supine to Sit       Sit to supine: Min assist   General bed mobility  comments: increased time and very effortful, needs cues for technique and bed rail for pulling trunk toward EOB; pt not using RUE during transfer    Transfers Overall transfer level: Needs assistance Equipment used: Rolling walker (2 wheels) Transfers: Sit to/from Stand, Bed to chair/wheelchair/BSC Sit to Stand: Min assist, +2 physical assistance           General transfer comment: Rt knee blocked upon standing, pt having difficulty placing RUE on RW handle needs min guard to maintain initially    Ambulation/Gait Ambulation/Gait assistance: Mod assist, +2 physical assistance Gait Distance (Feet): 25 Feet (including prolonged standing break at sink) Assistive device: Rolling walker (2 wheels) Gait Pattern/deviations: Step-to pattern, Decreased stride length, Decreased step length - left, Decreased step length - right, Decreased stance time - right, Knees buckling       General Gait Details: pt given manual assist for RLE advancement and heavy cues for sequencing, pt with less buckling today than yesterday but needs consistent verbal and tactile cues for Rt quad activation in stance phase   Modified Rankin (Stroke Patients Only) Modified Rankin (Stroke Patients Only) Pre-Morbid Rankin Score: No symptoms Modified Rankin: Moderately severe disability     Balance Overall balance assessment: Needs assistance Sitting-balance support: Bilateral upper extremity supported, Feet unsupported Sitting balance-Leahy Scale: Fair Sitting balance - Comments: able to sit on EOB with supervision to min guard   Standing balance support: Bilateral upper extremity supported Standing balance-Leahy Scale: Poor Standing balance comment: reliant on UE support while standing, RW and external assist; Rt lean with static standing but able to perform task standing at sink with intermittent minA for balance when not using UE for support        Cognition Arousal/Alertness: Awake/alert Behavior During  Therapy: Flat affect Overall Cognitive Status:  Impaired/Different from baseline Area of Impairment: Following commands, Awareness, Attention     Orientation Level: Place, Situation, Time Current Attention Level: Sustained   Following Commands: Follows one step commands with increased time   Awareness: Emergent   General Comments: Increased time to follow commands, reports that occluded glasses are assisting with decreased double vision but does report dizziness still; very soft voice               Pertinent Vitals/Pain Pain Assessment Pain Assessment: Faces Faces Pain Scale: Hurts even more Pain Location: generalized body aches Pain Descriptors / Indicators: Aching, Discomfort Pain Intervention(s): Monitored during session, Premedicated before session, Repositioned, Other (comment) (RN gave Tylenol prior to therapy session)     PT Goals (current goals can now be found in the care plan section) Acute Rehab PT Goals Patient Stated Goal: to feel better PT Goal Formulation: With patient Time For Goal Achievement: 07/12/21 Progress towards PT goals: Progressing toward goals    Frequency    Min 4X/week      PT Plan Current plan remains appropriate    Co-evaluation PT/OT/SLP Co-Evaluation/Treatment: Yes Reason for Co-Treatment: For patient/therapist safety;To address functional/ADL transfers PT goals addressed during session: Mobility/safety with mobility;Balance;Proper use of DME;Strengthening/ROM        AM-PAC PT "6 Clicks" Mobility   Outcome Measure  Help needed turning from your back to your side while in a flat bed without using bedrails?: A Little Help needed moving from lying on your back to sitting on the side of a flat bed without using bedrails?: A Little Help needed moving to and from a bed to a chair (including a wheelchair)?: A Lot Help needed standing up from a chair using your arms (e.g., wheelchair or bedside chair)?: A Lot (mod cues) Help needed  to walk in hospital room?: A Lot Help needed climbing 3-5 steps with a railing? : Total 6 Click Score: 13    End of Session Equipment Utilized During Treatment: Gait belt Activity Tolerance: Patient tolerated treatment well Patient left: in chair;with call bell/phone within reach;with chair alarm set Nurse Communication: Mobility status PT Visit Diagnosis: Unsteadiness on feet (R26.81);Muscle weakness (generalized) (M62.81);Difficulty in walking, not elsewhere classified (R26.2)     Time: 1236-1300 PT Time Calculation (min) (ACUTE ONLY): 24 min  Charges:  $Gait Training: 8-22 mins                     Shakia Sebastiano P., PTA Acute Rehabilitation Services Pager: [phone] Office: [place] 07/01/2021, 1:18 PM

## 2021-07-01 NOTE — Progress Notes (Signed)
PROGRESS NOTE    Patrick Carter  [id number] DOB: Dec 06, 1970 DOA: 06/26/2021 PCP: Patient, No Pcp Per (Inactive)    Brief Narrative:  HPI: Patrick Carter is a 51 yo male with a PMHx of DM II and RA on methotrexate and prednisone (not taking), HTN, gout, and prior CVA without sequelae, who arrived at [facility] via EMS after being found down x 2 hours per EMS. A neurology tele code stroke consult was done at OSH with findings of right sided weakness and confusion. CTH showed Left thalamic hemorrhage with 68mm left to right midline shift and chronic bilateral basal ganglia lacunar infarcts.  CTA showed no LVO or high grade stenosis of intracranial arteries. NSU was called with no suggestion for intervention at this time.    Glucose 158 on arrival. UDS negative. He remained sleepy and confused at OSH. He arrived at [facility] and was admitted to 4N ICU.    1/19 [place] pickup from stroke team  1/20 no overnight issues 1/21 working with physical therapy   Consultants:  Neurology  Procedures:   Antimicrobials:      Subjective: Moving around with therapy.  Denies any shortness of breath, chest pain, or any new complaints  Objective: Vitals:   06/30/21 1250 06/30/21 1547 07/01/21 0009 07/01/21 0323  BP: 111/82 118/83 103/69 114/88  Pulse: 98 87 92 89  Resp: 17 18 18 18   Temp: 98.4 F (36.9 C) 98.4 F (36.9 C) 99.5 F (37.5 C) 99.3 F (37.4 C)  TempSrc: Oral Oral Oral Oral  SpO2: 99% 98% 97% 96%  Weight:      Height:        Intake/Output Summary (Last 24 hours) at 07/01/2021 0814 Last data filed at 07/01/2021 0324 Gross per 24 hour  Intake 177 ml  Output 1175 ml  Net -998 ml   Filed Weights   06/26/21 0700 06/27/21 0500  Weight: 60.1 kg 54.8 kg    Examination: Nad, calm Cta no w/r Reg s1/s2 no gallop Soft benign +bs No edema Mood and affect appropriate in current setting   Data Reviewed: I have personally reviewed following labs and imaging studies  CBC: Recent  Labs  Lab 06/26/21 0126 06/26/21 0920  WBC 11.6* 8.0  HGB 14.0 15.9  HCT 43.7 48.3  MCV 87.9 86.1  PLT 227 [id number]   Basic Metabolic Panel: Recent Labs  Lab 06/26/21 0126 06/26/21 0920  NA 136 136  K 3.6 3.6  CL 104 98  CO2 23 26  GLUCOSE 158* 141*  BUN 13 12  CREATININE 0.76 0.87  CALCIUM 9.1 9.4   GFR: Estimated Creatinine Clearance: 77.9 mL/min (by C-G formula based on SCr of 0.87 mg/dL). Liver Function Tests: Recent Labs  Lab 06/26/21 0126 06/26/21 0920  AST 23 23  ALT 16 16  ALKPHOS 78 81  BILITOT 0.9 0.7  PROT 7.6 8.0  ALBUMIN 4.1 4.0   No results for input(s): LIPASE, AMYLASE in the last 168 hours. Recent Labs  Lab 06/26/21 0142  AMMONIA 26   Coagulation Profile: Recent Labs  Lab 06/26/21 0211 06/26/21 0920  INR 1.0 0.9   Cardiac Enzymes: Recent Labs  Lab 06/29/21 1625  CKTOTAL 32*   BNP (last 3 results) No results for input(s): PROBNP in the last 8760 hours. HbA1C: No results for input(s): HGBA1C in the last 72 hours. CBG: Recent Labs  Lab 06/30/21 0655 06/30/21 1248 06/30/21 1640 06/30/21 2147 07/01/21 0533  GLUCAP 167* 191* 178* 142* 152*   Lipid Profile:  No results for input(s): CHOL, HDL, LDLCALC, TRIG, CHOLHDL, LDLDIRECT in the last 72 hours. Thyroid Function Tests: No results for input(s): TSH, T4TOTAL, FREET4, T3FREE, THYROIDAB in the last 72 hours. Anemia Panel: No results for input(s): VITAMINB12, FOLATE, FERRITIN, TIBC, IRON, RETICCTPCT in the last 72 hours. Sepsis Labs: No results for input(s): PROCALCITON, LATICACIDVEN in the last 168 hours.  Recent Results (from the past 240 hour(s))  Resp Panel by RT-PCR (Flu A&B, Covid) Nasopharyngeal Swab     Status: None   Collection Time: 06/26/21  2:11 AM   Specimen: Nasopharyngeal Swab; Nasopharyngeal(NP) swabs in vial transport medium  Result Value Ref Range Status   SARS Coronavirus 2 by RT PCR NEGATIVE NEGATIVE Final    Comment: (NOTE) SARS-CoV-2 target nucleic acids are  NOT DETECTED.  The SARS-CoV-2 RNA is generally detectable in upper respiratory specimens during the acute phase of infection. The lowest concentration of SARS-CoV-2 viral copies this assay can detect is 138 copies/mL. A negative result does not preclude SARS-Cov-2 infection and should not be used as the sole basis for treatment or other patient management decisions. A negative result may occur with  improper specimen collection/handling, submission of specimen other than nasopharyngeal swab, presence of viral mutation(s) within the areas targeted by this assay, and inadequate number of viral copies(<138 copies/mL). A negative result must be combined with clinical observations, patient history, and epidemiological information. The expected result is Negative.  Fact Sheet for Patients:  EntrepreneurPulse.com.au  Fact Sheet for Healthcare Providers:  IncredibleEmployment.be  This test is no t yet approved or cleared by the [place] FDA and  has been authorized for detection and/or diagnosis of SARS-CoV-2 by FDA under an Emergency Use Authorization (EUA). This EUA will remain  in effect (meaning this test can be used) for the duration of the COVID-19 declaration under Section 564(b)(1) of the Act, 21 U.S.C.section 360bbb-3(b)(1), unless the authorization is terminated  or revoked sooner.       Influenza A by PCR NEGATIVE NEGATIVE Final   Influenza B by PCR NEGATIVE NEGATIVE Final    Comment: (NOTE) The Xpert Xpress SARS-CoV-2/FLU/RSV plus assay is intended as an aid in the diagnosis of influenza from Nasopharyngeal swab specimens and should not be used as a sole basis for treatment. Nasal washings and aspirates are unacceptable for Xpert Xpress SARS-CoV-2/FLU/RSV testing.  Fact Sheet for Patients: EntrepreneurPulse.com.au  Fact Sheet for Healthcare Providers: IncredibleEmployment.be  This test is not  yet approved or cleared by the [place] FDA and has been authorized for detection and/or diagnosis of SARS-CoV-2 by FDA under an Emergency Use Authorization (EUA). This EUA will remain in effect (meaning this test can be used) for the duration of the COVID-19 declaration under Section 564(b)(1) of the Act, 21 U.S.C. section 360bbb-3(b)(1), unless the authorization is terminated or revoked.  Performed at [facility], [address] [place], [place] [address]   MRSA Next Gen by PCR, Nasal     Status: None   Collection Time: 06/26/21  7:00 AM   Specimen: Nasal Mucosa; Nasal Swab  Result Value Ref Range Status   MRSA by PCR Next Gen NOT DETECTED NOT DETECTED Final    Comment: (NOTE) The GeneXpert MRSA Assay (FDA approved for NASAL specimens only), is one component of a comprehensive MRSA colonization surveillance program. It is not intended to diagnose MRSA infection nor to guide or monitor treatment for MRSA infections. Test performance is not FDA approved in patients less than 62 years old. Performed at [facility], Between [address] [place], [place] [address]          Radiology Studies: No results found.      Scheduled Meds:  amLODipine  5 mg Oral Daily   atorvastatin  40 mg Oral QPM   Chlorhexidine Gluconate Cloth  6 each Topical Daily   folic acid  1 mg Oral Daily   insulin aspart  0-15 Units Subcutaneous TID WC   methotrexate  15 mg Oral Weekly   pantoprazole  40 mg Oral QHS   polyethylene glycol  17 g Oral BID   senna-docusate  1 tablet Oral BID   Continuous Infusions:  Assessment & Plan:   Principal Problem:   ICH (intracerebral hemorrhage) ([place]) Active Problems:   Hypertension complicating diabetes ([place])   Hypertensive emergency   Rheumatoid arthritis [facility])   Patrick Carter is a 51 y.o. male with history of DM2, RA, HTN, gout and stroke who presented to [facility] yesterday after being found down for two hours and was found to  have a left thalamic hemorrhage.  Neurosurgery was consulted but did not wish to intervene at this time.  He was then transferred here for further evaluation and treatment.  Hypertonic saline was initially started, has now been discontinued.  Plan to transfer to the medical hospitalist tomorrow.  Transfer orders placed.   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 CT head Acute left thalamic hemorrhage with 37mm left to right midline shift, chronic basal ganglia lacunar infarcts  CTA head & neck Unchanged left thalamic IPH, no LVO or high grade stenosis MRI  left thalamic IPH, stable in size with mild surrounding edema, mild chronic small vessel ischemic changes 2D Echo EF 0000000, grade 1 diastolic dysfunction, cannot exclude small PFO Follow up CT unchanged left thalamic IPH No antithrombotic prior to admission, now on No antithrombotic secondary to IPH Goal LDL less than 70 Goal A1c less than 7.0  Repeat CT of the head this a.m. reviewed.  Notified neurology of results . Baseline CK 32  Plan for acute inpatient rehab pending  Continue neuro checks 1/21 continue with statin Neurology rec. Eye patch for diplopia PT/OT rec. CIR           Hypertension Strict blood pressure control with systolic less than 0000000  [id number] stable Continue current meds      Hyperlipidemia LDL elevated, goal less than 70 1/21 continue with statin       Diabetes type II Uncontrolled Home meds:  metformin 500 mg daily HgbA1c 11.9, goal < 7.0 1/21 BG labile, overall stable Continue riss  Plan CIR     DVT prophylaxis: SCD Code Status: Full Family Communication: None at bedside Disposition Plan: CIR Status is: Inpatient  Remains inpatient appropriate because: Unsafe discharge.  CIR pending            LOS: 5 days   Time spent: 35 minutes with more than 50% on [place], MD [facility] Pager [phone]  If 7PM-7AM, please contact  night-coverage 07/01/2021, 8:14 AM

## 2021-07-02 LAB — GLUCOSE, CAPILLARY
Glucose-Capillary: 147 mg/dL — ABNORMAL HIGH (ref 70–99)
Glucose-Capillary: 175 mg/dL — ABNORMAL HIGH (ref 70–99)
Glucose-Capillary: 177 mg/dL — ABNORMAL HIGH (ref 70–99)
Glucose-Capillary: 185 mg/dL — ABNORMAL HIGH (ref 70–99)

## 2021-07-02 NOTE — Plan of Care (Signed)
  Problem: Education: Goal: Knowledge of disease or condition will improve Outcome: Progressing Goal: Knowledge of secondary prevention will improve (SELECT ALL) Outcome: Progressing Goal: Knowledge of patient specific risk factors will improve (INDIVIDUALIZE FOR PATIENT) Outcome: Progressing Goal: Individualized Educational Video(s) Outcome: Progressing   Problem: Coping: Goal: Will verbalize positive feelings about self Outcome: Progressing Goal: Will identify appropriate support needs Outcome: Progressing   Problem: Health Behavior/Discharge Planning: Goal: Ability to manage health-related needs will improve Outcome: Progressing   Problem: Self-Care: Goal: Ability to participate in self-care as condition permits will improve Outcome: Progressing Goal: Verbalization of feelings and concerns over difficulty with self-care will improve Outcome: Progressing Goal: Ability to communicate needs accurately will improve Outcome: Progressing   Problem: Nutrition: Goal: Risk of aspiration will decrease Outcome: Progressing Goal: Dietary intake will improve Outcome: Progressing   Problem: Intracerebral Hemorrhage Tissue Perfusion: Goal: Complications of Intracerebral Hemorrhage will be minimized Outcome: Progressing   

## 2021-07-02 NOTE — Progress Notes (Signed)
PROGRESS NOTE    Patrick Carter  [id number] DOB: 05-Dec-1970 DOA: 06/26/2021 PCP: Patient, No Pcp Per (Inactive)    Brief Narrative:  HPI: Patrick Carter is a 51 yo male with a PMHx of DM II and RA on methotrexate and prednisone (not taking), HTN, gout, and prior CVA without sequelae, who arrived at [facility] via EMS after being found down x 2 hours per EMS. A neurology tele code stroke consult was done at OSH with findings of right sided weakness and confusion. CTH showed Left thalamic hemorrhage with 32mm left to right midline shift and chronic bilateral basal ganglia lacunar infarcts.  CTA showed no LVO or high grade stenosis of intracranial arteries. NSU was called with no suggestion for intervention at this time.    Glucose 158 on arrival. UDS negative. He remained sleepy and confused at OSH. He arrived at [facility] and was admitted to [facility] ICU.    1/19 [place] pickup from stroke team  1/22 no overnight issues   Consultants:  Neurology  Procedures:   Antimicrobials:      Subjective: Has no complaints. Has not eaten lunch yet as he is not too hungry  Objective: Vitals:   07/01/21 2316 07/02/21 0338 07/02/21 0729 07/02/21 1100  BP: (!) 112/93 (!) 105/91 (!) 115/91 120/84  Pulse: 94 81 87 89  Resp: 18 18 18    Temp: 99.5 F (37.5 C) 97.8 F (36.6 C) 98 F (36.7 C) 98.2 F (36.8 C)  TempSrc: Oral Oral    SpO2: 98% 98% 100% 100%  Weight:      Height:        Intake/Output Summary (Last 24 hours) at 07/02/2021 1301 Last data filed at 07/01/2021 2251 Gross per 24 hour  Intake --  Output 1000 ml  Net -1000 ml   Filed Weights   06/26/21 0700 06/27/21 0500  Weight: 60.1 kg 54.8 kg    Examination: Calm, nad Cta no w/r Reg s1/s2 no gallop Soft benign +bs No edema Alert and oriented.  3/5 RT UE/LE  5/5 LU/LE. No facial droop    Data Reviewed: I have personally reviewed following labs and imaging studies  CBC: Recent Labs  Lab 06/26/21 0126 06/26/21 0920  WBC  11.6* 8.0  HGB 14.0 15.9  HCT 43.7 48.3  MCV 87.9 86.1  PLT 227 [id number]   Basic Metabolic Panel: Recent Labs  Lab 06/26/21 0126 06/26/21 0920  NA 136 136  K 3.6 3.6  CL 104 98  CO2 23 26  GLUCOSE 158* 141*  BUN 13 12  CREATININE 0.76 0.87  CALCIUM 9.1 9.4   GFR: Estimated Creatinine Clearance: 77.9 mL/min (by C-G formula based on SCr of 0.87 mg/dL). Liver Function Tests: Recent Labs  Lab 06/26/21 0126 06/26/21 0920  AST 23 23  ALT 16 16  ALKPHOS 78 81  BILITOT 0.9 0.7  PROT 7.6 8.0  ALBUMIN 4.1 4.0   No results for input(s): LIPASE, AMYLASE in the last 168 hours. Recent Labs  Lab 06/26/21 0142  AMMONIA 26   Coagulation Profile: Recent Labs  Lab 06/26/21 0211 06/26/21 0920  INR 1.0 0.9   Cardiac Enzymes: Recent Labs  Lab 06/29/21 1625  CKTOTAL 32*   BNP (last 3 results) No results for input(s): PROBNP in the last 8760 hours. HbA1C: No results for input(s): HGBA1C in the last 72 hours. CBG: Recent Labs  Lab 07/01/21 0533 07/01/21 1141 07/01/21 1640 07/01/21 2121 07/02/21 0635  GLUCAP 152* 148* 238* 191* 147*  Lipid Profile: No results for input(s): CHOL, HDL, LDLCALC, TRIG, CHOLHDL, LDLDIRECT in the last 72 hours. Thyroid Function Tests: No results for input(s): TSH, T4TOTAL, FREET4, T3FREE, THYROIDAB in the last 72 hours. Anemia Panel: No results for input(s): VITAMINB12, FOLATE, FERRITIN, TIBC, IRON, RETICCTPCT in the last 72 hours. Sepsis Labs: No results for input(s): PROCALCITON, LATICACIDVEN in the last 168 hours.  Recent Results (from the past 240 hour(s))  Resp Panel by RT-PCR (Flu A&B, Covid) Nasopharyngeal Swab     Status: None   Collection Time: 06/26/21  2:11 AM   Specimen: Nasopharyngeal Swab; Nasopharyngeal(NP) swabs in vial transport medium  Result Value Ref Range Status   SARS Coronavirus 2 by RT PCR NEGATIVE NEGATIVE Final    Comment: (NOTE) SARS-CoV-2 target nucleic acids are NOT DETECTED.  The SARS-CoV-2 RNA is  generally detectable in upper respiratory specimens during the acute phase of infection. The lowest concentration of SARS-CoV-2 viral copies this assay can detect is 138 copies/mL. A negative result does not preclude SARS-Cov-2 infection and should not be used as the sole basis for treatment or other patient management decisions. A negative result may occur with  improper specimen collection/handling, submission of specimen other than nasopharyngeal swab, presence of viral mutation(s) within the areas targeted by this assay, and inadequate number of viral copies(<138 copies/mL). A negative result must be combined with clinical observations, patient history, and epidemiological information. The expected result is Negative.  Fact Sheet for Patients:  EntrepreneurPulse.com.au  Fact Sheet for Healthcare Providers:  IncredibleEmployment.be  This test is no t yet approved or cleared by the [place] FDA and  has been authorized for detection and/or diagnosis of SARS-CoV-2 by FDA under an Emergency Use Authorization (EUA). This EUA will remain  in effect (meaning this test can be used) for the duration of the COVID-19 declaration under Section 564(b)(1) of the Act, 21 U.S.C.section 360bbb-3(b)(1), unless the authorization is terminated  or revoked sooner.       Influenza A by PCR NEGATIVE NEGATIVE Final   Influenza B by PCR NEGATIVE NEGATIVE Final    Comment: (NOTE) The Xpert Xpress SARS-CoV-2/FLU/RSV plus assay is intended as an aid in the diagnosis of influenza from Nasopharyngeal swab specimens and should not be used as a sole basis for treatment. Nasal washings and aspirates are unacceptable for Xpert Xpress SARS-CoV-2/FLU/RSV testing.  Fact Sheet for Patients: EntrepreneurPulse.com.au  Fact Sheet for Healthcare Providers: IncredibleEmployment.be  This test is not yet approved or cleared by the [place] FDA and has been authorized for detection and/or diagnosis of SARS-CoV-2 by FDA under an Emergency Use Authorization (EUA). This EUA will remain in effect (meaning this test can be used) for the duration of the COVID-19 declaration under Section 564(b)(1) of the Act, 21 U.S.C. section 360bbb-3(b)(1), unless the authorization is terminated or revoked.  Performed at [facility], [address] [place], [place] [address]   MRSA Next Gen by PCR, Nasal     Status: None   Collection Time: 06/26/21  7:00 AM   Specimen: Nasal Mucosa; Nasal Swab  Result Value Ref Range Status   MRSA by PCR Next Gen NOT DETECTED NOT DETECTED Final    Comment: (NOTE) The GeneXpert MRSA Assay (FDA approved for NASAL specimens only), is one component of a comprehensive MRSA colonization surveillance program. It is not intended to diagnose MRSA infection nor to guide or monitor treatment for MRSA infections. Test performance is not FDA approved in patients less than 23 years old. Performed  at [facility], [place] [address], [place], [place] [address]          Radiology Studies: No results found.      Scheduled Meds:  amLODipine  5 mg Oral Daily   atorvastatin  40 mg Oral QPM   folic acid  1 mg Oral Daily   insulin aspart  0-15 Units Subcutaneous TID WC   methotrexate  15 mg Oral Weekly   pantoprazole  40 mg Oral QHS   polyethylene glycol  17 g Oral BID   senna-docusate  1 tablet Oral BID   Continuous Infusions:  Assessment & Plan:   Principal Problem:   ICH (intracerebral hemorrhage) ([place]) Active Problems:   Hypertension complicating diabetes ([place])   Hypertensive emergency   Rheumatoid arthritis [facility])   Patrick Carter is a 51 y.o. male with history of DM2, RA, HTN, gout and stroke who presented to [facility] yesterday after being found down for two hours and was found to have a left thalamic hemorrhage.  Neurosurgery was consulted but did not wish to intervene at  this time.  He was then transferred here for further evaluation and treatment.  Hypertonic saline was initially started, has now been discontinued.  Plan to transfer to the medical hospitalist tomorrow.  Transfer orders placed.   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 CT head Acute left thalamic hemorrhage with 79mm left to right midline shift, chronic basal ganglia lacunar infarcts  CTA head & neck Unchanged left thalamic IPH, no LVO or high grade stenosis MRI  left thalamic IPH, stable in size with mild surrounding edema, mild chronic small vessel ischemic changes 2D Echo EF 0000000, grade 1 diastolic dysfunction, cannot exclude small PFO Follow up CT unchanged left thalamic IPH No antithrombotic prior to admission, now on No antithrombotic secondary to IPH Goal LDL less than 70 Goal A1c less than 7.0  Repeat CT of the head this a.m. reviewed.  Notified neurology of results . Baseline CK 32  Plan for acute inpatient rehab pending  Continue neuro checks 1/21 continue with statin Neurology rec. Eye patch for diplopia 1/22 PT/OT rec CIR; overall he is progressing            Hypertension Strict blood pressure control with systolic less than 0000000  0000000 stable Continue current regimen      Hyperlipidemia LDL elevated, goal less than 70 1/22 continue statin       Diabetes type II Uncontrolled Home meds:  metformin 500 mg daily HgbA1c 11.9, goal < 7.0 BG stable overall  Continue riss       DVT prophylaxis: SCD Code Status: Full Family Communication: None at bedside Disposition Plan: CIR Status is: Inpatient  Remains inpatient appropriate because: Unsafe discharge.  CIR pending            LOS: 6 days   Time spent: 35 minutes with more than 50% on [place]o [place], MD [facility] Pager [phone]  If 7PM-7AM, please contact night-coverage 07/02/2021, 1:01 PM

## 2021-07-03 DIAGNOSIS — R636 Underweight: Secondary | ICD-10-CM

## 2021-07-03 LAB — GLUCOSE, CAPILLARY
Glucose-Capillary: 151 mg/dL — ABNORMAL HIGH (ref 70–99)
Glucose-Capillary: 173 mg/dL — ABNORMAL HIGH (ref 70–99)
Glucose-Capillary: 175 mg/dL — ABNORMAL HIGH (ref 70–99)
Glucose-Capillary: 180 mg/dL — ABNORMAL HIGH (ref 70–99)

## 2021-07-03 NOTE — Progress Notes (Signed)
Physical Therapy Treatment Patient Details Name: Patrick Carter MRN: [id number] DOB: [birth year] Today's Date: 07/03/2021   History of Present Illness 51 yo male [facility] after being found down for 2 hours, had L thalamic hemorrhage. PMH: HTN, HLD, DM2 (uncontrolled).    PT Comments    Pt was sleeping upon entry into the room and reported being a tired. He participated in treatment session with OT earlier this morning. In supine, he exhibited deficits in isolated strength of his R LE when asked to move. Patient was able to perform bed mobility with min to mod assist needing help to manage his R LE. He was able to stand and walk forward, backward, and side to side with Mod assistance and B UE support on a rolling walker. Patient was visibly fatigued at the end of the session and exhibited decreased tolerance. During treatment session patient showed deficits in strength, endurance, activity tolerance. Recommending therapy services at acute inpatient rehab to address the previously stated deficits. Will continue to follow acutely to maximize functional mobility, independence and safety.   Recommendations for follow up therapy are one component of a multi-disciplinary discharge planning process, led by the attending physician.  Recommendations may be updated based on patient status, additional functional criteria and insurance authorization.  Follow Up Recommendations  Acute inpatient rehab (3hours/day)     Assistance Recommended at Discharge PRN  Patient can return home with the following Two people to help with walking and/or transfers;Two people to help with bathing/dressing/bathroom;Assist for transportation;Direct supervision/assist for financial management;Direct supervision/assist for medications management;Help with stairs or ramp for entrance;Assistance with cooking/housework   Equipment Recommendations  Other (comment) (TBD)    Recommendations for Other Services        Precautions / Restrictions Precautions Precautions: Fall Restrictions Weight Bearing Restrictions: No     Mobility  Bed Mobility Overal bed mobility: Needs Assistance Bed Mobility: Supine to Sit, Sit to Supine     Supine to sit: Mod assist Sit to supine: Min assist   General bed mobility comments: the mejority of the help needed to manage his right leg during mobility    Transfers Overall transfer level: Needs assistance Equipment used: Rolling walker (2 wheels) Transfers: Sit to/from Stand Sit to Stand: Mod assist                Ambulation/Gait Ambulation/Gait assistance: Mod assist, +2 physical assistance Gait Distance (Feet): 8 Feet Assistive device: Rolling walker (2 wheels) Gait Pattern/deviations: Step-to pattern, Decreased stride length, Decreased step length - right, Knees buckling Gait velocity: decreased     General Gait Details: Patient was able to walk forward and backward, as well as, side step along the bed.   Stairs             Wheelchair Mobility    Modified Rankin (Stroke Patients Only) Modified Rankin (Stroke Patients Only) Pre-Morbid Rankin Score: No symptoms Modified Rankin: Moderately severe disability     Balance Overall balance assessment: Needs assistance Sitting-balance support: Bilateral upper extremity supported, Feet unsupported Sitting balance-Leahy Scale: Fair Sitting balance - Comments: was able to sit EOB with supervision at times but was mostly mid guard and min assist.   Standing balance support: Bilateral upper extremity supported, During functional activity, Reliant on assistive device for balance Standing balance-Leahy Scale: Poor                              Cognition Arousal/Alertness:  Awake/alert Behavior During Therapy: Flat affect Overall Cognitive Status: Impaired/Different from baseline Area of Impairment: Following commands, Awareness, Attention                    Current Attention Level: Sustained   Following Commands: Follows one step commands with increased time       General Comments: Patient spoke softly and lost track of what he was saying during open ended questioning.        Exercises Other Exercises Other Exercises: R knee extention exercise, 10x, Seated on EOB Other Exercises: R manual hamstring stretch 2x 45 seconds, seated on EOB Other Exercises: Seated Heel Raises, x15, at EOB    General Comments        Pertinent Vitals/Pain Pain Assessment Pain Assessment: Faces Faces Pain Scale: Hurts little more Pain Location: Generalized pain on his right side Pain Descriptors / Indicators: Aching    Home Living                          Prior Function            PT Goals (current goals can now be found in the care plan section)      Frequency    Min 4X/week      PT Plan Current plan remains appropriate    Co-evaluation              AM-PAC PT "6 Clicks" Mobility   Outcome Measure  Help needed turning from your back to your side while in a flat bed without using bedrails?: A Little Help needed moving from lying on your back to sitting on the side of a flat bed without using bedrails?: A Little Help needed moving to and from a bed to a chair (including a wheelchair)?: A Little Help needed standing up from a chair using your arms (e.g., wheelchair or bedside chair)?: A Lot Help needed to walk in hospital room?: A Lot Help needed climbing 3-5 steps with a railing? : Total 6 Click Score: 14    End of Session Equipment Utilized During Treatment: Gait belt Activity Tolerance: Patient tolerated treatment well Patient left: with call bell/phone within reach;in bed;with bed alarm set   PT Visit Diagnosis: Unsteadiness on feet (R26.81);Muscle weakness (generalized) (M62.81);Difficulty in walking, not elsewhere classified (R26.2);Other symptoms and signs involving the nervous system (R29.898)     Time:  0786-7544 PT Time Calculation (min) (ACUTE ONLY): 24 min  Charges:  $Gait Training: 8-22 mins $Therapeutic Activity: 8-22 mins                     Armanda Heritage, SPT    Armanda Heritage 07/03/2021, 4:39 PM

## 2021-07-03 NOTE — Progress Notes (Signed)
Occupational Therapy Treatment Patient Details Name: Patrick Carter MRN: [id number] DOB: 12-Jul-1970 Today's Date: 07/03/2021   History of present illness 51 yo male presented to [facility] on 06/26/21 after being found down for 2 hours, had L thalamic hemorrhage. PMH: HTN, HLD, DM2 (uncontrolled).   OT comments  Patient received in bed with family present and eager to participate. Patient was min assist to get to EOB and mod assist to transfer to recliner with RW.  Patient was able to stand at sink for grooming tasks with assistance to open toothpaste and RUE as active assist. Patient instructed in BUE ROM exercises to increase RUE shoulder flexion and abduction with SROM and AAROM exercises. Patient states he has less dizziness and believes occlusion glasses were helping. Acute OT to continue to follow.    Recommendations for follow up therapy are one component of a multi-disciplinary discharge planning process, led by the attending physician.  Recommendations may be updated based on patient status, additional functional criteria and insurance authorization.    Follow Up Recommendations  Acute inpatient rehab (3hours/day)    Assistance Recommended at Discharge Frequent or constant Supervision/Assistance  Patient can return home with the following  Two people to help with walking and/or transfers;A lot of help with bathing/dressing/bathroom;Assist for transportation;Help with stairs or ramp for entrance;Direct supervision/assist for medications management;Assistance with cooking/housework;Direct supervision/assist for financial management   Equipment Recommendations  BSC/3in1;Tub/shower seat    Recommendations for Other Services      Precautions / Restrictions Precautions Precautions: Fall       Mobility Bed Mobility Overal bed mobility: Needs Assistance Bed Mobility: Supine to Sit     Supine to sit: Min assist     General bed mobility comments: increased time and verbal cues to  use rails and to stay on task    Transfers Overall transfer level: Needs assistance Equipment used: Rolling walker (2 wheels) Transfers: Sit to/from Stand, Bed to chair/wheelchair/BSC Sit to Stand: Mod assist     Step pivot transfers: Mod assist     General transfer comment: difficulty using RUE with RW, decreased RLE knee buckling     Balance Overall balance assessment: Needs assistance Sitting-balance support: Bilateral upper extremity supported, Feet unsupported Sitting balance-Leahy Scale: Fair Sitting balance - Comments: able to sit on EOB with supervision   Standing balance support: Single extremity supported, Bilateral upper extremity supported Standing balance-Leahy Scale: Poor Standing balance comment: reliant on UE support when standing.  Stood at sink for grooming                           ADL either performed or assessed with clinical judgement   ADL Overall ADL's : Needs assistance/impaired     Grooming: Wash/dry hands;Wash/dry face;Oral care;Minimal assistance;Standing Grooming Details (indicate cue type and reason): performed standing at sink with assistance for balance. Patient required asssitance to open toothpaste and was able to hold toothbrush with RUE for LUE to apply toothpaste                               General ADL Comments: demonstrated improved standing balance    Extremity/Trunk Assessment Upper Extremity Assessment RUE Deficits / Details: Inconsistent at times but grossly 2+/5 RUE Sensation: WNL RUE Coordination: decreased gross motor;decreased fine motor            Vision   Vision Assessment?: Vision impaired- to be further tested in  functional context Eye Alignment: Impaired (comment) Ocular Range of Motion: Restricted on the right Tracking/Visual Pursuits: Impaired - to be further tested in functional context Saccades: Additional eye shifts occurred during testing Convergence: Impaired (comment) Diplopia  Assessment: Objects split side to side;Disappears with one eye closed Depth Perception: Undershoots Additional Comments: states occlusion glasses were helping   Perception     Praxis      Cognition Arousal/Alertness: Awake/alert Behavior During Therapy: Flat affect Overall Cognitive Status: Impaired/Different from baseline Area of Impairment: Following commands, Awareness, Attention                 Orientation Level: Place, Situation, Time Current Attention Level: Sustained   Following Commands: Follows one step commands with increased time   Awareness: Emergent   General Comments: states he has decreased dizziness and that the occlusion glasses were helping        Exercises Exercises: General Upper Extremity General Exercises - Upper Extremity Shoulder Flexion: AAROM, Self ROM, Both, 10 reps, Seated Shoulder ABduction: AAROM, Self ROM, Both, 10 reps, Seated Shoulder ADduction: AAROM, Self ROM, Both, 10 reps, Seated    Shoulder Instructions       General Comments      Pertinent Vitals/ Pain       Pain Assessment Pain Assessment: Faces Faces Pain Scale: Hurts a little bit Pain Location: generalized body aches Pain Descriptors / Indicators: Aching Pain Intervention(s): Monitored during session, Repositioned  Home Living                                          Prior Functioning/Environment              Frequency  Min 2X/week        Progress Toward Goals  OT Goals(current goals can now be found in the care plan section)  Progress towards OT goals: Progressing toward goals  Acute Rehab OT Goals Patient Stated Goal: get stronger OT Goal Formulation: With patient Time For Goal Achievement: 07/12/21 Potential to Achieve Goals: Good ADL Goals Pt Will Perform Grooming: sitting;with min guard assist Pt Will Perform Upper Body Bathing: with min guard assist;sitting Pt Will Perform Upper Body Dressing: with min guard  assist;sitting Pt Will Transfer to Toilet: bedside commode;stand pivot transfer;with mod assist Additional ADL Goal #1: Pt will demonstrate use of partial occlusion with min VC to reduce symptoms of diplopia and improve functional vision  Plan Discharge plan remains appropriate;Frequency remains appropriate    Co-evaluation                 AM-PAC OT "6 Clicks" Daily Activity     Outcome Measure   Help from another person eating meals?: A Little Help from another person taking care of personal grooming?: A Little Help from another person toileting, which includes using toliet, bedpan, or urinal?: A Lot Help from another person bathing (including washing, rinsing, drying)?: A Lot Help from another person to put on and taking off regular upper body clothing?: A Lot Help from another person to put on and taking off regular lower body clothing?: A Lot 6 Click Score: 14    End of Session Equipment Utilized During Treatment: Gait belt;Rolling walker (2 wheels)  OT Visit Diagnosis: Unsteadiness on feet (R26.81);Muscle weakness (generalized) (M62.81);Low vision, both eyes (H54.2);Hemiplegia and hemiparesis Hemiplegia - Right/Left: Right Hemiplegia - dominant/non-dominant: Dominant Hemiplegia - caused by: Nontraumatic intracerebral hemorrhage  Activity Tolerance Patient tolerated treatment well   Patient Left in chair;with call bell/phone within reach;with chair alarm set   Nurse Communication Mobility status        Time: [id number] OT Time Calculation (min): 33 min  Charges: OT General Charges $OT Visit: 1 Visit OT Treatments $Self Care/Home Management : 8-22 mins $Therapeutic Exercise: 8-22 mins  Lodema Hong, OTA Acute Rehabilitation Services  Pager [phone] Office [place] 07/03/2021, 12:56 PM

## 2021-07-03 NOTE — Plan of Care (Signed)
  Problem: Education: Goal: Knowledge of disease or condition will improve Outcome: Progressing Goal: Knowledge of secondary prevention will improve (SELECT ALL) Outcome: Progressing Goal: Knowledge of patient specific risk factors will improve (INDIVIDUALIZE FOR PATIENT) Outcome: Progressing Goal: Individualized Educational Video(s) Outcome: Progressing   Problem: Coping: Goal: Will verbalize positive feelings about self Outcome: Progressing Goal: Will identify appropriate support needs Outcome: Progressing   Problem: Health Behavior/Discharge Planning: Goal: Ability to manage health-related needs will improve Outcome: Progressing   Problem: Self-Care: Goal: Ability to participate in self-care as condition permits will improve Outcome: Progressing Goal: Verbalization of feelings and concerns over difficulty with self-care will improve Outcome: Progressing Goal: Ability to communicate needs accurately will improve Outcome: Progressing   Problem: Nutrition: Goal: Risk of aspiration will decrease Outcome: Progressing Goal: Dietary intake will improve Outcome: Progressing   Problem: Intracerebral Hemorrhage Tissue Perfusion: Goal: Complications of Intracerebral Hemorrhage will be minimized Outcome: Progressing   

## 2021-07-03 NOTE — Progress Notes (Addendum)
PROGRESS NOTE    Patrick Carter  [id number] DOB: 12-12-1970 DOA: 06/26/2021 PCP: Patient, No Pcp Per (Inactive)    Brief Narrative:  HPI: [name] is a 51 yo male with a PMHx of DM II and RA on methotrexate and prednisone (not taking), HTN, gout, and prior CVA without sequelae, who arrived at [facility] via EMS after being found down x 2 hours per EMS. A neurology tele code stroke consult was done at OSH with findings of right sided weakness and confusion. CTH showed Left thalamic hemorrhage with 1mm left to right midline shift and chronic bilateral basal ganglia lacunar infarcts.  CTA showed no LVO or high grade stenosis of intracranial arteries. NSU was called with no suggestion for intervention at this time.    Glucose 158 on arrival. UDS negative. He remained sleepy and confused at OSH. He arrived at [facility] and was admitted to [facility].    1/19 [place] pickup from stroke team  1/22 no overnight issues 1/23 trying to eat lunch.  Family at bedside  Consultants:  Neurology  Procedures:   Antimicrobials:      Subjective: Has no complaints, no shortness of breath, dizziness, lightheadedness or chest pain   Objective: Vitals:   07/02/21 2337 07/03/21 0342 07/03/21 0723 07/03/21 1120  BP: (!) 121/93 126/90 (!) 125/99 (!) 120/92  Pulse: 86 92 86 89  Resp: 18 17 18 20   Temp: 98.9 F (37.2 C) 98.7 F (37.1 C) 98.7 F (37.1 C) 98.3 F (36.8 C)  TempSrc: Oral Oral Oral Oral  SpO2: 96% 98% 97% 96%  Weight:      Height:        Intake/Output Summary (Last 24 hours) at 07/03/2021 1330 Last data filed at 07/03/2021 1000 Gross per 24 hour  Intake 100 ml  Output 1800 ml  Net -1700 ml   Filed Weights   06/26/21 0700 06/27/21 0500  Weight: 60.1 kg 54.8 kg    Examination: Calm, NAD eating lunch sitting in chair  CTA no wheezing  Regular S1-S2 no gallops  Soft benign positive bowel sounds  No edema  Awake oriented x3  4/5 left upper/left lower extremity.  3 out of 5  right upper and lower extremity.  No facial droop.       Data Reviewed: I have personally reviewed following labs and imaging studies  CBC: No results for input(s): WBC, NEUTROABS, HGB, HCT, MCV, PLT in the last 168 hours.  Basic Metabolic Panel: No results for input(s): NA, K, CL, CO2, GLUCOSE, BUN, CREATININE, CALCIUM, MG, PHOS in the last 168 hours.  GFR: Estimated Creatinine Clearance: 77.9 mL/min (by C-G formula based on SCr of 0.87 mg/dL). Liver Function Tests: No results for input(s): AST, ALT, ALKPHOS, BILITOT, PROT, ALBUMIN in the last 168 hours.  No results for input(s): LIPASE, AMYLASE in the last 168 hours. No results for input(s): AMMONIA in the last 168 hours.  Coagulation Profile: No results for input(s): INR, PROTIME in the last 168 hours.  Cardiac Enzymes: Recent Labs  Lab 06/29/21 1625  CKTOTAL 32*   BNP (last 3 results) No results for input(s): PROBNP in the last 8760 hours. HbA1C: No results for input(s): HGBA1C in the last 72 hours. CBG: Recent Labs  Lab 07/02/21 1519 07/02/21 1641 07/02/21 2129 07/03/21 0630 07/03/21 1121  GLUCAP 175* 177* 185* 151* 173*   Lipid Profile: No results for input(s): CHOL, HDL, LDLCALC, TRIG, CHOLHDL, LDLDIRECT in the last 72 hours. Thyroid Function Tests: No results for  input(s): TSH, T4TOTAL, FREET4, T3FREE, THYROIDAB in the last 72 hours. Anemia Panel: No results for input(s): VITAMINB12, FOLATE, FERRITIN, TIBC, IRON, RETICCTPCT in the last 72 hours. Sepsis Labs: No results for input(s): PROCALCITON, LATICACIDVEN in the last 168 hours.  Recent Results (from the past 240 hour(s))  Resp Panel by RT-PCR (Flu A&B, Covid) Nasopharyngeal Swab     Status: None   Collection Time: 06/26/21  2:11 AM   Specimen: Nasopharyngeal Swab; Nasopharyngeal(NP) swabs in vial transport medium  Result Value Ref Range Status   SARS Coronavirus 2 by RT PCR NEGATIVE NEGATIVE Final    Comment: (NOTE) SARS-CoV-2 target nucleic  acids are NOT DETECTED.  The SARS-CoV-2 RNA is generally detectable in upper respiratory specimens during the acute phase of infection. The lowest concentration of SARS-CoV-2 viral copies this assay can detect is 138 copies/mL. A negative result does not preclude SARS-Cov-2 infection and should not be used as the sole basis for treatment or other patient management decisions. A negative result may occur with  improper specimen collection/handling, submission of specimen other than nasopharyngeal swab, presence of viral mutation(s) within the areas targeted by this assay, and inadequate number of viral copies(<138 copies/mL). A negative result must be combined with clinical observations, patient history, and epidemiological information. The expected result is Negative.  Fact Sheet for Patients:  EntrepreneurPulse.com.au  Fact Sheet for Healthcare Providers:  IncredibleEmployment.be  This test is no t yet approved or cleared by the [place] FDA and  has been authorized for detection and/or diagnosis of SARS-CoV-2 by FDA under an Emergency Use Authorization (EUA). This EUA will remain  in effect (meaning this test can be used) for the duration of the COVID-19 declaration under Section 564(b)(1) of the Act, 21 U.S.C.section 360bbb-3(b)(1), unless the authorization is terminated  or revoked sooner.       Influenza A by PCR NEGATIVE NEGATIVE Final   Influenza B by PCR NEGATIVE NEGATIVE Final    Comment: (NOTE) The Xpert Xpress SARS-CoV-2/FLU/RSV plus assay is intended as an aid in the diagnosis of influenza from Nasopharyngeal swab specimens and should not be used as a sole basis for treatment. Nasal washings and aspirates are unacceptable for Xpert Xpress SARS-CoV-2/FLU/RSV testing.  Fact Sheet for Patients: EntrepreneurPulse.com.au  Fact Sheet for Healthcare Providers: IncredibleEmployment.be  This  test is not yet approved or cleared by the [place] FDA and has been authorized for detection and/or diagnosis of SARS-CoV-2 by FDA under an Emergency Use Authorization (EUA). This EUA will remain in effect (meaning this test can be used) for the duration of the COVID-19 declaration under Section 564(b)(1) of the Act, 21 U.S.C. section 360bbb-3(b)(1), unless the authorization is terminated or revoked.  Performed at [facility], [address] [place], [place] [address]   MRSA Next Gen by PCR, Nasal     Status: None   Collection Time: 06/26/21  7:00 AM   Specimen: Nasal Mucosa; Nasal Swab  Result Value Ref Range Status   MRSA by PCR Next Gen NOT DETECTED NOT DETECTED Final    Comment: (NOTE) The GeneXpert MRSA Assay (FDA approved for NASAL specimens only), is one component of a comprehensive MRSA colonization surveillance program. It is not intended to diagnose MRSA infection nor to guide or monitor treatment for MRSA infections. Test performance is not FDA approved in patients less than 37 years old. Performed at [facility], [place] [address] [place], [place] [address]          Radiology Studies:  No results found.      Scheduled Meds:  amLODipine  5 mg Oral Daily   atorvastatin  40 mg Oral QPM   folic acid  1 mg Oral Daily   insulin aspart  0-15 Units Subcutaneous TID WC   methotrexate  15 mg Oral Weekly   pantoprazole  40 mg Oral QHS   polyethylene glycol  17 g Oral BID   senna-docusate  1 tablet Oral BID   Continuous Infusions:  Assessment & Plan:   Principal Problem:   ICH (intracerebral hemorrhage) ([place]) Active Problems:   Hypertension complicating diabetes ([place])   Hypertensive emergency   Rheumatoid arthritis [facility])   Mr. KALLEN SINAGRA is a 51 y.o. male with history of DM2, RA, HTN, gout and stroke who presented to [facility] yesterday after being found down for two hours and was found to have a left thalamic hemorrhage.   Neurosurgery was consulted but did not wish to intervene at this time.  He was then transferred here for further evaluation and treatment.  Hypertonic saline was initially started, has now been discontinued.  Plan to transfer to the medical hospitalist tomorrow.  Transfer orders placed.   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 reviewed.  Notified neurology of results . Baseline CK 32  Plan for acute inpatient rehab pending  Continue neuro checks 1/21 continue with statin Neurology rec. Eye patch for diplopia 1/23 PT OT recommend SAR, overall he is progressing.               Hypertension Strict blood pressure control with systolic less than 0000000  [id number] blood pressure control and at goal Continue current management      Hyperlipidemia LDL elevated, goal less than 70 1/23 continue on statins       Diabetes type II Uncontrolled, with hyperglycemia Home meds:  metformin 500 mg daily HgbA1c 11.9, goal < 7.0 1/23 continue with R-ISS    BMI 17.33 Underweight Encouraged po intake as patient appetite has not been great.  On dysphagia 3 diet.    DVT prophylaxis: SCD Code Status: Full Family Communication: None at bedside Disposition Plan: CIR Status is: Inpatient  Remains inpatient appropriate because: Unsafe discharge.  CIR pending            LOS: 7 days   Time spent: 35 minutes  with more than 50% on [place], MD [facility] Pager [phone]  If 7PM-7AM, please contact night-coverage 07/03/2021, 1:30 PM

## 2021-07-04 DIAGNOSIS — E0865 Diabetes mellitus due to underlying condition with hyperglycemia: Secondary | ICD-10-CM

## 2021-07-04 LAB — GLUCOSE, CAPILLARY
Glucose-Capillary: 173 mg/dL — ABNORMAL HIGH (ref 70–99)
Glucose-Capillary: 203 mg/dL — ABNORMAL HIGH (ref 70–99)
Glucose-Capillary: 133 mg/dL — ABNORMAL HIGH (ref 70–99)
Glucose-Capillary: 254 mg/dL — ABNORMAL HIGH (ref 70–99)

## 2021-07-04 NOTE — Plan of Care (Signed)
°  Problem: Education: Goal: Knowledge of disease or condition will improve Outcome: Progressing   Problem: Coping: Goal: Will verbalize positive feelings about self Outcome: Progressing   Problem: Health Behavior/Discharge Planning: Goal: Ability to manage health-related needs will improve Outcome: Progressing   Problem: Self-Care: Goal: Ability to participate in self-care as condition permits will improve Outcome: Progressing Goal: Ability to communicate needs accurately will improve Outcome: Progressing   Problem: Nutrition: Goal: Risk of aspiration will decrease Outcome: Progressing Goal: Dietary intake will improve Outcome: Progressing

## 2021-07-04 NOTE — Progress Notes (Addendum)
Inpatient Rehabilitation Admissions Coordinator   I contacted pt's nephew, Mr Patrick Carter by phone to follow up on previous conversation from last week. Patient currently has no caregiver supports at home. I have asked for Pt's sister in [place] to contact me to discuss rehab options and caregiver supports.  Danne Baxter, RN, MSN Rehab Admissions Coordinator [phone] 07/04/2021 8:54 AM  I spoke with pt's sister, Patrick Carter by phone. She will arrive tomorrow from [place] and will follow up with me after seeing her brother to further discuss his rehab needs, caregiver supports and long term deposition options.  Patrick Carter phone [phone]. I met with patient at bedside and he is aware of plans.  Danne Baxter, RN, MSN Rehab Admissions Coordinator [phone] 07/04/2021 11:15 AM

## 2021-07-04 NOTE — Progress Notes (Signed)
Speech Language Pathology Treatment: Cognitive-Linquistic  Patient Details Name: Patrick Carter MRN: [id number] DOB: 06-28-1970 Today's Date: 07/04/2021 Time: 0175-1025 SLP Time Calculation (min) (ACUTE ONLY): 18 min  Assessment / Plan / Recommendation Clinical Impression  Treatment focused on language therapy. Today he exhibited more aphasic speech versus apraxic. There were no phonemic paraphasias, false starts, phoneme substitutions. Anomia present during generative tasks in sentences such as stating his occupation, where he lives, how he comes to [facility] from [place] etc, stating "it's hard to explain" or inability to arrive at desired word. Pt aware of language difficulty. Therapist provided examples of useful strategies for word finding impairments. He was awake but drowsy throughout session. Recommend continue language therapy on acute and at discharge.    HPI HPI: 51 y.o. 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 ICH score of 1(for GCS of 12). Exam with R sided weakness, ataxia, sensory deficit and extinction. Likely had a lacune that bled. Plan is strict BP control, repeat imaging and stroke workup. Not on AC.      SLP Plan  Continue with current plan of care      Recommendations for follow up therapy are one component of a multi-disciplinary discharge planning process, led by the attending physician.  Recommendations may be updated based on patient status, additional functional criteria and insurance authorization.    Recommendations                   Oral Care Recommendations: Oral care BID Follow Up Recommendations:  (TBD) Assistance recommended at discharge: Frequent or constant Supervision/Assistance SLP Visit Diagnosis: Aphasia (R47.01) Plan: Continue with current plan of care           Royce Macadamia  07/04/2021, 2:27 PM  Breck Coons Lonell Face.Ed Nurse, children's  [phone] Office [phone]

## 2021-07-04 NOTE — Progress Notes (Signed)
Physical Therapy Treatment Patient Details Name: Patrick Carter MRN: [id number] DOB: 08-03-1970 Today's Date: 07/04/2021   History of Present Illness 51 yo male [facility] after being found down for 2 hours, had L thalamic hemorrhage. PMH: HTN, HLD, DM2 (uncontrolled).    PT Comments    Pt was able to ambulate into the hallway, perform sidesteps at the rail, and walk back into the room with Mod assist, proximal stabilization of the pelvis, and seated breaks throughout. Pt needed to be cued to sift his weight anteriorly during ambulation and was easily fatigued. Throughout the session he exhibited righ neglect running into obstacles with the walker and not paying attention to right extremities. With sit to stand transfer, pt needed Mod assistance. During treatment session patient showed deficits in strength, endurance, activity tolerance. Recommending therapy services at acute inpatient therapy to address the previously stated deficits. Will continue to follow acutely to maximize functional mobility, independence and safety.   Recommendations for follow up therapy are one component of a multi-disciplinary discharge planning process, led by the attending physician.  Recommendations may be updated based on patient status, additional functional criteria and insurance authorization.  Follow Up Recommendations  Acute inpatient rehab (3hours/day)     Assistance Recommended at Discharge PRN  Patient can return home with the following Two people to help with walking and/or transfers;Two people to help with bathing/dressing/bathroom;Assist for transportation;Direct supervision/assist for financial management;Direct supervision/assist for medications management;Help with stairs or ramp for entrance;Assistance with cooking/housework   Equipment Recommendations  Other (comment) (TBD)    Recommendations for Other Services       Precautions / Restrictions Precautions Precautions:  Fall Restrictions Weight Bearing Restrictions: No     Mobility  Bed Mobility Overal bed mobility: Needs Assistance Bed Mobility: Sit to Supine       Sit to supine: Min assist   General bed mobility comments: patient required assistance moving his right leg during bed mobility.    Transfers Overall transfer level: Needs assistance Equipment used: Rolling walker (2 wheels) Transfers: Sit to/from Stand, Bed to chair/wheelchair/BSC Sit to Stand: Mod assist   Step pivot transfers: Mod assist, +2 physical assistance       General transfer comment: difficulty positioning R LE and R UE    Ambulation/Gait Ambulation/Gait assistance: Mod assist Gait Distance (Feet): 25 Feet (Patient ambulated 25 ft. 2x) Assistive device: Rolling walker (2 wheels) Gait Pattern/deviations: Step-to pattern, Decreased stride length, Decreased step length - right, Knees buckling, Decreased weight shift to right, Decreased dorsiflexion - right Gait velocity: decreased     General Gait Details: Patient exhibited increased knee hyper ext. towrds the end of the session.   Stairs             Wheelchair Mobility    Modified Rankin (Stroke Patients Only) Modified Rankin (Stroke Patients Only) Pre-Morbid Rankin Score: No symptoms Modified Rankin: Moderately severe disability     Balance Overall balance assessment: Needs assistance Sitting-balance support: Bilateral upper extremity supported, Feet supported Sitting balance-Leahy Scale: Fair Sitting balance - Comments: patient required more UE support when asked to march seated at EOB   Standing balance support: Bilateral upper extremity supported, During functional activity, Reliant on assistive device for balance Standing balance-Leahy Scale: Poor Standing balance comment: reliant on UE support when standing and walking tasks             High level balance activites: Side stepping High Level Balance Comments: side stepping at  railing in the  hallway 8 ft to the right and 8 ft to the left, with mod assist and cues to lean anteriorly and face the wall.            Cognition Arousal/Alertness: Awake/alert Behavior During Therapy: Flat affect Overall Cognitive Status: Impaired/Different from baseline Area of Impairment: Following commands, Awareness, Attention                   Current Attention Level: Sustained   Following Commands: Follows one step commands with increased time       General Comments: Patient spoke softly and lost track of what he was saying during open ended questioning.        Exercises Other Exercises Other Exercises: Alternating marches with LE seated at EOB, x10 bil    General Comments        Pertinent Vitals/Pain Pain Assessment Pain Assessment: Faces Faces Pain Scale: Hurts little more Pain Location: pain on left leg with ambulation    Home Living                          Prior Function            PT Goals (current goals can now be found in the care plan section)      Frequency    Min 4X/week      PT Plan Current plan remains appropriate    Co-evaluation              AM-PAC PT "6 Clicks" Mobility   Outcome Measure  Help needed turning from your back to your side while in a flat bed without using bedrails?: A Little Help needed moving from lying on your back to sitting on the side of a flat bed without using bedrails?: A Little Help needed moving to and from a bed to a chair (including a wheelchair)?: A Lot Help needed standing up from a chair using your arms (e.g., wheelchair or bedside chair)?: A Lot Help needed to walk in hospital room?: A Lot Help needed climbing 3-5 steps with a railing? : Total 6 Click Score: 13    End of Session Equipment Utilized During Treatment: Gait belt Activity Tolerance: Patient tolerated treatment well;Patient limited by fatigue Patient left: with call bell/phone within reach;in bed;with bed  alarm set Nurse Communication: Mobility status;Other (comment) (Catheter was reattached) PT Visit Diagnosis: Unsteadiness on feet (R26.81);Muscle weakness (generalized) (M62.81);Other symptoms and signs involving the nervous system (R29.898)     Time: 1423-1500 PT Time Calculation (min) (ACUTE ONLY): 37 min  Charges:  $Gait Training: 8-22 mins $Therapeutic Activity: 8-22 mins                     Armanda HeritageJeff Yuepheng Schaller, SPT    Armanda HeritageJeff Destani Wamser 07/04/2021, 3:52 PM

## 2021-07-04 NOTE — Plan of Care (Signed)
Pt has been doing much better. He still has a flat affect. He pivot to the [facility]. He had a BM. Cond. Cath in place. Not eating much but attempting all meals.  Problem: Education: Goal: Knowledge of disease or condition will improve Outcome: Progressing Goal: Knowledge of secondary prevention will improve (SELECT ALL) Outcome: Progressing Goal: Knowledge of patient specific risk factors will improve (INDIVIDUALIZE FOR PATIENT) Outcome: Progressing Goal: Individualized Educational Video(s) Outcome: Progressing   Problem: Coping: Goal: Will verbalize positive feelings about self Outcome: Progressing Goal: Will identify appropriate support needs Outcome: Progressing   Problem: Health Behavior/Discharge Planning: Goal: Ability to manage health-related needs will improve Outcome: Progressing   Problem: Self-Care: Goal: Ability to participate in self-care as condition permits will improve Outcome: Progressing Goal: Verbalization of feelings and concerns over difficulty with self-care will improve Outcome: Progressing Goal: Ability to communicate needs accurately will improve Outcome: Progressing   Problem: Nutrition: Goal: Risk of aspiration will decrease Outcome: Progressing Goal: Dietary intake will improve Outcome: Progressing   Problem: Intracerebral Hemorrhage Tissue Perfusion: Goal: Complications of Intracerebral Hemorrhage will be minimized Outcome: Progressing   Problem: Education: Goal: Knowledge of General Education information will improve Description: Including pain rating scale, medication(s)/side effects and non-pharmacologic comfort measures Outcome: Progressing   Problem: Health Behavior/Discharge Planning: Goal: Ability to manage health-related needs will improve Outcome: Progressing   Problem: Clinical Measurements: Goal: Ability to maintain clinical measurements within normal limits will improve Outcome: Progressing Goal: Will remain free from  infection Outcome: Progressing Goal: Diagnostic test results will improve Outcome: Progressing Goal: Respiratory complications will improve Outcome: Progressing Goal: Cardiovascular complication will be avoided Outcome: Progressing   Problem: Activity: Goal: Risk for activity intolerance will decrease Outcome: Progressing   Problem: Nutrition: Goal: Adequate nutrition will be maintained Outcome: Progressing   Problem: Coping: Goal: Level of anxiety will decrease Outcome: Progressing   Problem: Elimination: Goal: Will not experience complications related to bowel motility Outcome: Progressing Goal: Will not experience complications related to urinary retention Outcome: Progressing   Problem: Pain Managment: Goal: General experience of comfort will improve Outcome: Progressing   Problem: Safety: Goal: Ability to remain free from injury will improve Outcome: Progressing   Problem: Skin Integrity: Goal: Risk for impaired skin integrity will decrease Outcome: Progressing

## 2021-07-04 NOTE — Progress Notes (Addendum)
PROGRESS NOTE    Patrick Carter  [id number] DOB: 08/02/1970 DOA: 06/26/2021 PCP: Patient, No Pcp Per (Inactive)    Brief Narrative:  HPI: [name] is a 51 yo male with a PMHx of DM II and RA on methotrexate and prednisone (not taking), HTN, gout, and prior CVA without sequelae, who arrived at [facility] via EMS after being found down x 2 hours per EMS. A neurology tele code stroke consult was done at OSH with findings of right sided weakness and confusion. CTH showed Left thalamic hemorrhage with 47mm left to right midline shift and chronic bilateral basal ganglia lacunar infarcts.  CTA showed no LVO or high grade stenosis of intracranial arteries. NSU was called with no suggestion for intervention at this time.    Glucose 158 on arrival. UDS negative. He remained sleepy and confused at OSH. He arrived at [facility] and was admitted to [place] ICU.    1/19 [place] pickup from stroke team  1/24 walking outside of room with PT. Monitored him, taking small steps.  Definetly making strides.   Consultants:  Neurology  Procedures:   Antimicrobials:      Subjective: Has no sob, HA, or complaints. Had bath today   Objective: Vitals:   07/03/21 2110 07/04/21 0025 07/04/21 0411 07/04/21 0728  BP: 119/82 107/81 (!) 114/94 106/85  Pulse: 93 86 92 85  Resp: 16 15 14 16   Temp: 99.3 F (37.4 C) 98.9 F (37.2 C) (!) 97.4 F (36.3 C) 98.2 F (36.8 C)  TempSrc: Oral Oral Axillary Oral  SpO2: 98% 99% 98% 97%  Weight:      Height:        Intake/Output Summary (Last 24 hours) at 07/04/2021 0827 Last data filed at 07/03/2021 1800 Gross per 24 hour  Intake 540 ml  Output 400 ml  Net 140 ml   Filed Weights   06/26/21 0700 06/27/21 0500  Weight: 60.1 kg 54.8 kg    Examination: Calm, nad Cta no w/r Reg s1/s2 no gallop No edema  Mood and affect appropriate in current setting     Data Reviewed: I have personally reviewed following labs and imaging studies  CBC: No results for  input(s): WBC, NEUTROABS, HGB, HCT, MCV, PLT in the last 168 hours.  Basic Metabolic Panel: No results for input(s): NA, K, CL, CO2, GLUCOSE, BUN, CREATININE, CALCIUM, MG, PHOS in the last 168 hours.  GFR: Estimated Creatinine Clearance: 77.9 mL/min (by C-G formula based on SCr of 0.87 mg/dL). Liver Function Tests: No results for input(s): AST, ALT, ALKPHOS, BILITOT, PROT, ALBUMIN in the last 168 hours.  No results for input(s): LIPASE, AMYLASE in the last 168 hours. No results for input(s): AMMONIA in the last 168 hours.  Coagulation Profile: No results for input(s): INR, PROTIME in the last 168 hours.  Cardiac Enzymes: Recent Labs  Lab 06/29/21 1625  CKTOTAL 32*   BNP (last 3 results) No results for input(s): PROBNP in the last 8760 hours. HbA1C: No results for input(s): HGBA1C in the last 72 hours. CBG: Recent Labs  Lab 07/03/21 0630 07/03/21 1121 07/03/21 1622 07/03/21 2108 07/04/21 0627  GLUCAP 151* 173* 175* 180* 173*   Lipid Profile: No results for input(s): CHOL, HDL, LDLCALC, TRIG, CHOLHDL, LDLDIRECT in the last 72 hours. Thyroid Function Tests: No results for input(s): TSH, T4TOTAL, FREET4, T3FREE, THYROIDAB in the last 72 hours. Anemia Panel: No results for input(s): VITAMINB12, FOLATE, FERRITIN, TIBC, IRON, RETICCTPCT in the last 72 hours. Sepsis Labs: No results  for input(s): PROCALCITON, LATICACIDVEN in the last 168 hours.  Recent Results (from the past 240 hour(s))  Resp Panel by RT-PCR (Flu A&B, Covid) Nasopharyngeal Swab     Status: None   Collection Time: 06/26/21  2:11 AM   Specimen: Nasopharyngeal Swab; Nasopharyngeal(NP) swabs in vial transport medium  Result Value Ref Range Status   SARS Coronavirus 2 by RT PCR NEGATIVE NEGATIVE Final    Comment: (NOTE) SARS-CoV-2 target nucleic acids are NOT DETECTED.  The SARS-CoV-2 RNA is generally detectable in upper respiratory specimens during the acute phase of infection. The lowest concentration of  SARS-CoV-2 viral copies this assay can detect is 138 copies/mL. A negative result does not preclude SARS-Cov-2 infection and should not be used as the sole basis for treatment or other patient management decisions. A negative result may occur with  improper specimen collection/handling, submission of specimen other than nasopharyngeal swab, presence of viral mutation(s) within the areas targeted by this assay, and inadequate number of viral copies(<138 copies/mL). A negative result must be combined with clinical observations, patient history, and epidemiological information. The expected result is Negative.  Fact Sheet for Patients:  EntrepreneurPulse.com.au  Fact Sheet for Healthcare Providers:  IncredibleEmployment.be  This test is no t yet approved or cleared by the [place] FDA and  has been authorized for detection and/or diagnosis of SARS-CoV-2 by FDA under an Emergency Use Authorization (EUA). This EUA will remain  in effect (meaning this test can be used) for the duration of the COVID-19 declaration under Section 564(b)(1) of the Act, 21 U.S.C.section 360bbb-3(b)(1), unless the authorization is terminated  or revoked sooner.       Influenza A by PCR NEGATIVE NEGATIVE Final   Influenza B by PCR NEGATIVE NEGATIVE Final    Comment: (NOTE) The Xpert Xpress SARS-CoV-2/FLU/RSV plus assay is intended as an aid in the diagnosis of influenza from Nasopharyngeal swab specimens and should not be used as a sole basis for treatment. Nasal washings and aspirates are unacceptable for Xpert Xpress SARS-CoV-2/FLU/RSV testing.  Fact Sheet for Patients: EntrepreneurPulse.com.au  Fact Sheet for Healthcare Providers: IncredibleEmployment.be  This test is not yet approved or cleared by the [place] FDA and has been authorized for detection and/or diagnosis of SARS-CoV-2 by FDA under an Emergency Use  Authorization (EUA). This EUA will remain in effect (meaning this test can be used) for the duration of the COVID-19 declaration under Section 564(b)(1) of the Act, 21 U.S.C. section 360bbb-3(b)(1), unless the authorization is terminated or revoked.  Performed at [facility], [address] [place], [place] [address]   MRSA Next Gen by PCR, Nasal     Status: None   Collection Time: 06/26/21  7:00 AM   Specimen: Nasal Mucosa; Nasal Swab  Result Value Ref Range Status   MRSA by PCR Next Gen NOT DETECTED NOT DETECTED Final    Comment: (NOTE) The GeneXpert MRSA Assay (FDA approved for NASAL specimens only), is one component of a comprehensive MRSA colonization surveillance program. It is not intended to diagnose MRSA infection nor to guide or monitor treatment for MRSA infections. Test performance is not FDA approved in patients less than 40 years old. Performed at [facility], [place] [address] [place], [place] [address]          Radiology Studies: No results found.      Scheduled Meds:  amLODipine  5 mg Oral Daily   atorvastatin  40 mg Oral QPM   folic acid  1 mg  Oral Daily   insulin aspart  0-15 Units Subcutaneous TID WC   methotrexate  15 mg Oral Weekly   pantoprazole  40 mg Oral QHS   polyethylene glycol  17 g Oral BID   senna-docusate  1 tablet Oral BID   Continuous Infusions:  Assessment & Plan:   Principal Problem:   ICH (intracerebral hemorrhage) ([place]) Active Problems:   Hypertension complicating diabetes ([place])   Hypertensive emergency   Rheumatoid arthritis [facility])   [name] is a 51 y.o. male with history of DM2, RA, HTN, gout and stroke who presented to [facility] yesterday after being found down for two hours and was found to have a left thalamic hemorrhage.  Neurosurgery was consulted but did not wish to intervene at this time.  He was then transferred here for further evaluation and treatment.  Hypertonic saline was initially  started, has now been discontinued.  Plan to transfer to the medical hospitalist tomorrow.  Transfer orders placed.   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 CT head Acute left thalamic hemorrhage with 4mm left to right midline shift, chronic basal ganglia lacunar infarcts  CTA head & neck Unchanged left thalamic IPH, no LVO or high grade stenosis MRI  left thalamic IPH, stable in size with mild surrounding edema, mild chronic small vessel ischemic changes 2D Echo EF 0000000, grade 1 diastolic dysfunction, cannot exclude small PFO Follow up CT unchanged left thalamic IPH No antithrombotic prior to admission, now on No antithrombotic secondary to IPH Goal LDL less than 70 Goal A1c less than 7.0  Repeat CT of the head this a.m. reviewed.  Notified neurology of results . Baseline CK 32  Plan for acute inpatient rehab pending  Continue neuro checks 1/21 continue with statin Neurology rec. Eye patch for diplopia 1/24 progressing clinically.PT/OT rec. CIR. CIR will discuss with sister who will arrive from [place] .              Hypertension Strict blood pressure control with systolic less than 0000000  0000000 BP at goal Continue current management      Hyperlipidemia Continue statins, goal LDL less than 70       Diabetes type II Uncontrolled, with hyperglycemia Home meds:  metformin 500 mg daily HgbA1c 11.9, goal < 7.0 1/24 continue R-ISS    BMI 17.33 Underweight Encouraged po intake as patient appetite has not been great.  On dysphagia 3 diet.    DVT prophylaxis: SCD Code Status: Full Family Communication: None at bedside Disposition Plan: CIR Status is: Inpatient  Remains inpatient appropriate because: Unsafe discharge.  CIR pending Family meeting to discuss tomorrow by [place] .           LOS: 8 days   Time spent: 20 minutes with more than 50% on COC    Patrick Hanlon, MD [facility] Pager [phone]  If 7PM-7AM, please contact  night-coverage 07/04/2021, 8:27 AM

## 2021-07-05 LAB — GLUCOSE, CAPILLARY
Glucose-Capillary: 167 mg/dL — ABNORMAL HIGH (ref 70–99)
Glucose-Capillary: 197 mg/dL — ABNORMAL HIGH (ref 70–99)
Glucose-Capillary: 265 mg/dL — ABNORMAL HIGH (ref 70–99)
Glucose-Capillary: 117 mg/dL — ABNORMAL HIGH (ref 70–99)

## 2021-07-05 MED ORDER — INSULIN GLARGINE-YFGN 100 UNIT/ML ~~LOC~~ SOLN
10.0000 [IU] | Freq: Every day | SUBCUTANEOUS | Status: DC
  Administered 2021-07-05 – 2021-07-08 (×4): 10 [IU] via SUBCUTANEOUS
  Filled 2021-07-05 (×4): qty 0.1

## 2021-07-05 NOTE — Progress Notes (Signed)
PROGRESS NOTE  Patrick Carter  DOB: April 24, 1971  PCP: Patient, No Pcp Per (Inactive) [id number]  DOA: 06/26/2021  LOS: 9 days  Hospital Day: 10  Chief complaint: Found unconscious   Brief narrative: LADAMIAN Carter is a 51 y.o. male with PMH significant for DM2, HTN, prior CVA with sequelae, RA on MTX, prednisone, gout, noncompliance to medications. Patient was brought to the ED on 06/26/2021 after he was found unconscious on the floor for about 2 hours. In the ED, he was noted to have right-sided weakness and confusion CTH showed Left thalamic hemorrhage with 45mm left to right midline shift and chronic bilateral basal ganglia lacunar infarcts.  CTA showed no LVO or high grade stenosis of intracranial arteries.  He remained sleepy and confused and hence was admitted to ICU. Transferred out to [facility] on 1/19   Subjective: Patient was seen and examined this morning.  Pleasant middle-aged African-American male.  Sitting up in chair.  Not in distress.  No new symptoms.  Alert, awake, oriented x3  Assessment/Plan: Intracranial hemorrhage -Brought in after he was found on the floor. 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 -Stroke work-up completed. 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   -Current deficits include diplopia for which patient has eyepatch.   -PT/OT eval obtained.  CIR recommended  Dysphagia -Due to stroke  -Currently on dysphagia 3 diet   Essential hypertension -Currently blood pressure in low normal range on amlodipine 5 mg daily   Uncontrolled type 2 diabetes mellitus with hyperglycemia -A1c 11.9 on 06/26/2021 -Home meds include metformin 5 mg daily -Currently on sliding scale insulin only.   Blood sugar mostly less than 200.  We will start on Semglee 10 units from today. Recent Labs  Lab 07/04/21 1210 07/04/21 1602 07/04/21 2124 07/05/21 0610 07/05/21 1158  GLUCAP 203* 133* 254* 167* 197*   Hyperlipidemia -Continue statins, goal LDL less than 70   Underweight -BMI 17.33 -Encourage po intake as patient appetite has not been great.   Rheumatoid arthritis -On methotrexate and folic acid  Mobility: Encourage ambulation Living condition: Home Goals of care:   Code Status: Full Code  Nutritional status: Body mass index is 17.33 kg/m.      Diet:  Diet Order             DIET DYS 3 Room service appropriate? Yes; Fluid consistency: Thin  Diet effective now                  DVT prophylaxis:  Place and maintain sequential compression device Start: 06/27/21 0006   Antimicrobials: None Fluid: None Consultants: Neurology Family Communication: None at bedside  Status is: Inpatient  Continue in-hospital care because: Pending CIR Level of care: Telemetry Medical   Dispo: The patient is from: Home               Anticipated d/c is to: CIR              Patient currently is not medically stable to d/c.   Difficult to place patient No     Infusions:    Scheduled Meds:  amLODipine  5 mg Oral Daily   atorvastatin  40 mg Oral QPM   folic acid  1 mg Oral Daily   insulin  aspart  0-15 Units Subcutaneous TID WC   insulin glargine-yfgn  10 Units Subcutaneous Daily   methotrexate  15 mg Oral Weekly   pantoprazole  40 mg Oral QHS   polyethylene glycol  17 g Oral BID   senna-docusate  1 tablet Oral BID    PRN meds: acetaminophen **OR** acetaminophen (TYLENOL) oral liquid 160 mg/5 mL **OR** acetaminophen, ondansetron   Antimicrobials: Anti-infectives (From admission, onward)    None       Objective: Vitals:   07/05/21 0808 07/05/21 1156  BP: 110/84 95/68  Pulse: 91 93  Resp: 19 18  Temp: (!) 97.5 F (36.4 C) 98.4 F (36.9 C)  SpO2: 98% 98%    No intake or output data in the 24 hours ending 07/05/21 1504 Filed Weights   06/26/21 0700 06/27/21 0500  Weight: 60.1 kg 54.8 kg   Weight change:  Body mass index is 17.33 kg/m.   Physical Exam: General exam: Pleasant, middle-aged African-American male.  Not in physical distress Skin: No rashes, lesions or ulcers. HEENT: Atraumatic, normocephalic, no obvious bleeding.  Eye patch on the right Lungs: Clear to auscultation bilaterally CVS: Regular rate and rhythm, no murmur GI/Abd soft, nontender, nondistended, bowel sound present CNS: Alert, awake, oriented x3 Psychiatry: Mood appropriate Extremities: No pedal edema, no calf tenderness  Data Review: I have personally reviewed the laboratory data and studies available.  F/u labs ordered Unresulted Labs (From admission, onward)    None       Signed, Terrilee Croak, MD [facility] 07/05/2021

## 2021-07-05 NOTE — Progress Notes (Signed)
Occupational Therapy Treatment Patient Details Name: Patrick Carter MRN: [id number] DOB: Jan 01, 1971 Today's Date: 07/05/2021   History of present illness 51 yo male presented to [facility] on 06/26/21 after being found down for 2 hours, had L thalamic hemorrhage. PMH: HTN, HLD, DM2 (uncontrolled).   OT comments  Patient received in supine and agreeable to OT session. Patient was mod assist to get to EOB with assistance for RLE and trunk. Patient initially demonstrated right lateral leaning but was able to correct with verbal cues. Patient ambulated to sink for grooming tasks with mod assist and complaints of right hand pain when using walker. Patient was able to perform oral care standing at sink but required seated break to complete grooming tasks. Patient ambulated to recliner and was setup for self feeding with breakfast. Acute OT to continue to follow.    Recommendations for follow up therapy are one component of a multi-disciplinary discharge planning process, led by the attending physician.  Recommendations may be updated based on patient status, additional functional criteria and insurance authorization.    Follow Up Recommendations  Acute inpatient rehab (3hours/day)    Assistance Recommended at Discharge Frequent or constant Supervision/Assistance  Patient can return home with the following  A lot of help with walking and/or transfers;A lot of help with bathing/dressing/bathroom;Assistance with cooking/housework;Direct supervision/assist for medications management;Direct supervision/assist for financial management;Assist for transportation;Help with stairs or ramp for entrance   Equipment Recommendations  BSC/3in1;Tub/shower seat    Recommendations for Other Services      Precautions / Restrictions Precautions Precautions: Fall Restrictions Weight Bearing Restrictions: No       Mobility Bed Mobility Overal bed mobility: Needs Assistance Bed Mobility: Sit to Supine      Supine to sit: Mod assist     General bed mobility comments: verbal cues to use rail and assistance needed with RLE and trunk    Transfers Overall transfer level: Needs assistance Equipment used: Rolling walker (2 wheels) Transfers: Sit to/from Stand, Bed to chair/wheelchair/BSC Sit to Stand: Mod assist     Step pivot transfers: Mod assist     General transfer comment: difficulty using RUE with RW due to pain and weak grasp     Balance Overall balance assessment: Needs assistance Sitting-balance support: Bilateral upper extremity supported, Feet supported Sitting balance-Leahy Scale: Fair Sitting balance - Comments: initally patient demonstrated right lateral lean but was able to correct with verbal cues Postural control: Right lateral lean Standing balance support: Single extremity supported, Bilateral upper extremity supported, During functional activity Standing balance-Leahy Scale: Poor Standing balance comment: reliant on one extremity support while standing for grooming                           ADL either performed or assessed with clinical judgement   ADL Overall ADL's : Needs assistance/impaired     Grooming: Wash/dry hands;Wash/dry face;Oral care;Minimal assistance;Standing Grooming Details (indicate cue type and reason): required assistance with toothpaste due to weak RUE                               General ADL Comments: able to stand during oral care but required seated rest break following and completed grooming seated    Extremity/Trunk Assessment Upper Extremity Assessment RUE Deficits / Details: Inconsistent at times but grossly 2+/5 RUE Sensation: WNL RUE Coordination: decreased gross motor;decreased fine motor  Vision   Additional Comments: continues with double vision but occusion glasses were helpful per patient   Perception     Praxis      Cognition Arousal/Alertness: Awake/alert Behavior During  Therapy: Flat affect Overall Cognitive Status: Impaired/Different from baseline Area of Impairment: Following commands, Awareness, Attention                 Orientation Level: Place, Situation, Time Current Attention Level: Sustained   Following Commands: Follows one step commands with increased time   Awareness: Emergent   General Comments: continues to have douple vision but states that occusion glasses were helping        Exercises      Shoulder Instructions       General Comments      Pertinent Vitals/ Pain       Pain Assessment Pain Assessment: Faces Faces Pain Scale: Hurts little more Pain Location: right hand pain when using RW Pain Descriptors / Indicators: Aching, Grimacing Pain Intervention(s): Monitored during session, Repositioned, Patient requesting pain meds-RN notified  Home Living                                          Prior Functioning/Environment              Frequency  Min 2X/week        Progress Toward Goals  OT Goals(current goals can now be found in the care plan section)  Progress towards OT goals: Progressing toward goals  Acute Rehab OT Goals Patient Stated Goal: get better OT Goal Formulation: With patient Time For Goal Achievement: 07/12/21 Potential to Achieve Goals: Good ADL Goals Pt Will Perform Grooming: sitting;with min guard assist Pt Will Perform Upper Body Bathing: with min guard assist;sitting Pt Will Perform Upper Body Dressing: with min guard assist;sitting Pt Will Transfer to Toilet: bedside commode;stand pivot transfer;with mod assist Additional ADL Goal #1: Pt will demonstrate use of partial occlusion with min VC to reduce symptoms of diplopia and improve functional vision  Plan Discharge plan remains appropriate;Frequency remains appropriate    Co-evaluation                 AM-PAC OT "6 Clicks" Daily Activity     Outcome Measure   Help from another person eating meals?:  A Little Help from another person taking care of personal grooming?: A Little Help from another person toileting, which includes using toliet, bedpan, or urinal?: A Lot Help from another person bathing (including washing, rinsing, drying)?: A Lot Help from another person to put on and taking off regular upper body clothing?: A Lot Help from another person to put on and taking off regular lower body clothing?: A Lot 6 Click Score: 14    End of Session Equipment Utilized During Treatment: Gait belt;Rolling walker (2 wheels)  OT Visit Diagnosis: Unsteadiness on feet (R26.81);Muscle weakness (generalized) (M62.81);Low vision, both eyes (H54.2);Hemiplegia and hemiparesis Hemiplegia - Right/Left: Right Hemiplegia - dominant/non-dominant: Dominant Hemiplegia - caused by: Nontraumatic intracerebral hemorrhage   Activity Tolerance Patient tolerated treatment well   Patient Left in chair;with call bell/phone within reach;with chair alarm set   Nurse Communication Mobility status        Time: [id number] OT Time Calculation (min): 19 min  Charges: OT General Charges $OT Visit: 1 Visit OT Treatments $Self Care/Home Management : 8-22 mins  Lodema Hong, OTA Acute Rehabilitation Services  Pager [phone] Office [phone]   Trixie Dredge 07/05/2021, 9:39 AM

## 2021-07-05 NOTE — Progress Notes (Signed)
Physical Therapy Treatment Patient Details Name: Patrick Carter MRN: [id number] DOB: 07-01-1970 Today's Date: 07/05/2021   History of Present Illness 51 yo male presented to [facility] on 06/26/21 after being found down for 2 hours, had L thalamic hemorrhage. PMH: HTN, HLD, DM2 (uncontrolled).    PT Comments    Patient was able to perform seated leg strengthening exercises with manual resistance and needed increased time for motor control and muscle recruitment. He demonstrated improvements in motor control with sit to stands but is still favoring his left leg. When patient is cued to push with his right leg during sit to stand he experience pain in his right foot. During treatment session patient showed deficits in strength, endurance, activity tolerance. Recommending therapy services at acute inpatient rehab to address the previously stated deficits. Will continue to follow acutely to maximize functional mobility, independence and safety.    Recommendations for follow up therapy are one component of a multi-disciplinary discharge planning process, led by the attending physician.  Recommendations may be updated based on patient status, additional functional criteria and insurance authorization.  Follow Up Recommendations  Acute inpatient rehab (3hours/day)     Assistance Recommended at Discharge Intermittent Supervision/Assistance  Patient can return home with the following A lot of help with walking and/or transfers;A lot of help with bathing/dressing/bathroom;Assistance with cooking/housework;Assistance with feeding;Direct supervision/assist for medications management;Direct supervision/assist for financial management;Assist for transportation;Help with stairs or ramp for entrance   Equipment Recommendations  Rolling walker (2 wheels)    Recommendations for Other Services       Precautions / Restrictions Precautions Precautions: Fall Restrictions Weight Bearing Restrictions: No      Mobility  Bed Mobility Overal bed mobility: Needs Assistance         Sit to supine: Min guard        Transfers Overall transfer level: Needs assistance Equipment used: Rolling walker (2 wheels) Transfers: Sit to/from Stand, Bed to chair/wheelchair/BSC Sit to Stand: Mod assist (Performed 2 sets of 3 reps for theraputic intervention) Stand pivot transfers: Mod assist Step pivot transfers: Mod assist       General transfer comment: Patient had pain in his right foot when bearing weight through it.    Ambulation/Gait                   Stairs             Wheelchair Mobility    Modified Rankin (Stroke Patients Only) Modified Rankin (Stroke Patients Only) Pre-Morbid Rankin Score: No symptoms Modified Rankin: Moderately severe disability     Balance Overall balance assessment: Needs assistance Sitting-balance support: Bilateral upper extremity supported, Feet supported Sitting balance-Leahy Scale: Fair     Standing balance support: Single extremity supported, Bilateral upper extremity supported, During functional activity Standing balance-Leahy Scale: Poor Standing balance comment: Patient was min assist with standing balance or would rely on UE support.                            Cognition Arousal/Alertness: Awake/alert Behavior During Therapy: Flat affect Overall Cognitive Status: Impaired/Different from baseline                         Following Commands: Follows one step commands with increased time, Follows multi-step commands with increased time       General Comments: Pt reports that his vision has been improving. Pt is speaking louder and more clearly.  Exercises Other Exercises Other Exercises: knee flex/ext and hip flexion with manual resistance, x5 Bil    General Comments        Pertinent Vitals/Pain Pain Assessment Pain Assessment: Faces Faces Pain Scale: Hurts little more Pain Location: Right  sided pain. Pain with weightbaring through right foot. Pain Descriptors / Indicators: Aching, Grimacing Pain Intervention(s): Monitored during session    Home Living                          Prior Function            PT Goals (current goals can now be found in the care plan section)      Frequency    Min 4X/week      PT Plan Current plan remains appropriate    Co-evaluation              AM-PAC PT "6 Clicks" Mobility   Outcome Measure  Help needed turning from your back to your side while in a flat bed without using bedrails?: A Little Help needed moving from lying on your back to sitting on the side of a flat bed without using bedrails?: A Little Help needed moving to and from a bed to a chair (including a wheelchair)?: A Lot Help needed standing up from a chair using your arms (e.g., wheelchair or bedside chair)?: A Lot Help needed to walk in hospital room?: A Lot Help needed climbing 3-5 steps with a railing? : Total 6 Click Score: 13    End of Session Equipment Utilized During Treatment: Gait belt Activity Tolerance: Patient tolerated treatment well;Patient limited by fatigue;Patient limited by pain Patient left: in bed;with call bell/phone within reach;with nursing/sitter in room Nurse Communication: Mobility status PT Visit Diagnosis: Unsteadiness on feet (R26.81);Muscle weakness (generalized) (M62.81);Other symptoms and signs involving the nervous system (R29.898)     Time: [id number] PT Time Calculation (min) (ACUTE ONLY): 26 min  Charges:  $Therapeutic Exercise: 8-22 mins $Therapeutic Activity: 8-22 mins                     Armanda HeritageJeff Alhassan Everingham, SPT    Armanda HeritageJeff Havanna Groner 07/05/2021, 3:33 PM

## 2021-07-06 LAB — GLUCOSE, CAPILLARY
Glucose-Capillary: 135 mg/dL — ABNORMAL HIGH (ref 70–99)
Glucose-Capillary: 155 mg/dL — ABNORMAL HIGH (ref 70–99)
Glucose-Capillary: 129 mg/dL — ABNORMAL HIGH (ref 70–99)
Glucose-Capillary: 161 mg/dL — ABNORMAL HIGH (ref 70–99)

## 2021-07-06 MED ORDER — GABAPENTIN 600 MG PO TABS
300.0000 mg | ORAL_TABLET | Freq: Two times a day (BID) | ORAL | Status: DC
  Administered 2021-07-06 – 2021-07-12 (×13): 300 mg via ORAL
  Filled 2021-07-06 (×13): qty 1

## 2021-07-06 NOTE — Plan of Care (Signed)
  Problem: Education: Goal: Knowledge of disease or condition will improve Outcome: Progressing Goal: Knowledge of secondary prevention will improve (SELECT ALL) Outcome: Progressing Goal: Knowledge of patient specific risk factors will improve (INDIVIDUALIZE FOR PATIENT) Outcome: Progressing Goal: Individualized Educational Video(s) Outcome: Progressing   Problem: Coping: Goal: Will verbalize positive feelings about self Outcome: Progressing Goal: Will identify appropriate support needs Outcome: Progressing   Problem: Health Behavior/Discharge Planning: Goal: Ability to manage health-related needs will improve Outcome: Progressing   Problem: Self-Care: Goal: Ability to participate in self-care as condition permits will improve Outcome: Progressing Goal: Verbalization of feelings and concerns over difficulty with self-care will improve Outcome: Progressing Goal: Ability to communicate needs accurately will improve Outcome: Progressing   Problem: Nutrition: Goal: Risk of aspiration will decrease Outcome: Progressing Goal: Dietary intake will improve Outcome: Progressing   Problem: Intracerebral Hemorrhage Tissue Perfusion: Goal: Complications of Intracerebral Hemorrhage will be minimized Outcome: Progressing   Problem: Education: Goal: Knowledge of General Education information will improve Description: Including pain rating scale, medication(s)/side effects and non-pharmacologic comfort measures Outcome: Progressing   Problem: Health Behavior/Discharge Planning: Goal: Ability to manage health-related needs will improve Outcome: Progressing   Problem: Clinical Measurements: Goal: Ability to maintain clinical measurements within normal limits will improve Outcome: Progressing Goal: Will remain free from infection Outcome: Progressing Goal: Diagnostic test results will improve Outcome: Progressing Goal: Respiratory complications will improve Outcome:  Progressing Goal: Cardiovascular complication will be avoided Outcome: Progressing   Problem: Activity: Goal: Risk for activity intolerance will decrease Outcome: Progressing   Problem: Nutrition: Goal: Adequate nutrition will be maintained Outcome: Progressing   Problem: Coping: Goal: Level of anxiety will decrease Outcome: Progressing   Problem: Elimination: Goal: Will not experience complications related to bowel motility Outcome: Progressing Goal: Will not experience complications related to urinary retention Outcome: Progressing   Problem: Pain Managment: Goal: General experience of comfort will improve Outcome: Progressing   Problem: Safety: Goal: Ability to remain free from injury will improve Outcome: Progressing   Problem: Skin Integrity: Goal: Risk for impaired skin integrity will decrease Outcome: Progressing   

## 2021-07-06 NOTE — Progress Notes (Signed)
PROGRESS NOTE  Patrick Carter  DOB: 1971/01/27  PCP: Patient, No Pcp Per (Inactive) [id number]  DOA: 06/26/2021  LOS: 10 days  Hospital Day: 11  Chief complaint: Found unconscious   Brief narrative: Patrick Carter is a 51 y.o. male with PMH significant for DM2, HTN, prior CVA with sequelae, RA on MTX, prednisone, gout, noncompliance to medications. Patient was brought to the ED on 06/26/2021 after he was found unconscious on the floor for about 2 hours. In the ED, he was noted to have right-sided weakness and confusion CTH showed Left thalamic hemorrhage with 57mm left to right midline shift and chronic bilateral basal ganglia lacunar infarcts.  CTA showed no LVO or high grade stenosis of intracranial arteries.  He remained sleepy and confused and hence was admitted to ICU. Transferred out to [facility] on 1/19  Subjective: Patient was seen and examined this morning.   Pleasant middle-aged African-American male.  Lying on bed.  Not in distress.  No new symptoms.  Able to follow commands.  Assessment/Plan: Intracranial hemorrhage -Brought in after he was found on the floor. 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 -Stroke work-up completed. -Echo with EF 55 to 60%, grade 1 diastolic dysfunction, cannot exclude small PFO -Follow-up CT scan showed unchanged left thymic IPH -A1c 7, LDL less than 70 -Per neurology recommendation, continue statin.   -Current deficits include diplopia for which patient has eyepatch.   -PT/OT eval obtained.  CIR recommended  Dysphagia -Due to stroke  -Currently on dysphagia 3 diet   Essential hypertension -Currently blood pressure in low normal range on amlodipine 5 mg daily.  I will stop amlodipine for now.   Uncontrolled type 2 diabetes mellitus with hyperglycemia -A1c 11.9 on 06/26/2021 -Home meds include metformin 500 mg daily -Currently on  Semglee 10 units daily and sliding scale insulin.  Blood sugar is better today  Recent Labs  Lab 07/05/21 1158 07/05/21 1537 07/05/21 2123 07/06/21 0645 07/06/21 1109  GLUCAP 197* 265* 117* 135* 155*    Hyperlipidemia -Continue statins, goal LDL less than 70   Underweight -BMI 17.33 -Encourage po intake as patient appetite has not been great.   Rheumatoid arthritis -On methotrexate and folic acid  Mobility: Encourage ambulation Living condition: Home Goals of care:   Code Status: Full Code  Nutritional status: Body mass index is 17.33 kg/m.      Diet:  Diet Order             DIET DYS 3 Room service appropriate? Yes; Fluid consistency: Thin  Diet effective now                  DVT prophylaxis:  Place and maintain sequential compression device Start: 06/27/21 0006   Antimicrobials: None Fluid: None Consultants: Neurology Family Communication: None at bedside  Status is: Inpatient  Continue in-hospital care because: Pending CIR Level of care: Telemetry Medical   Dispo: The patient is from: Home               Anticipated d/c is to: CIR              Patient currently is not medically stable to d/c.   Difficult to place patient No     Infusions:    Scheduled Meds:  amLODipine  5 mg Oral Daily   atorvastatin  40 mg Oral QPM   folic acid  1 mg Oral Daily  insulin aspart  0-15 Units Subcutaneous TID WC   insulin glargine-yfgn  10 Units Subcutaneous Daily   methotrexate  15 mg Oral Weekly   pantoprazole  40 mg Oral QHS   polyethylene glycol  17 g Oral BID   senna-docusate  1 tablet Oral BID    PRN meds: acetaminophen **OR** acetaminophen (TYLENOL) oral liquid 160 mg/5 mL **OR** acetaminophen, ondansetron   Antimicrobials: Anti-infectives (From admission, onward)    None       Objective: Vitals:   07/06/21 0421 07/06/21 0840  BP: 103/73 101/67  Pulse: 87 92  Resp: 17   Temp: 97.6 F (36.4 C)   SpO2: 98% 99%    Intake/Output  Summary (Last 24 hours) at 07/06/2021 1124 Last data filed at 07/05/2021 1700 Gross per 24 hour  Intake --  Output 800 ml  Net -800 ml   Filed Weights   06/26/21 0700 06/27/21 0500  Weight: 60.1 kg 54.8 kg   Weight change:  Body mass index is 17.33 kg/m.   Physical Exam: General exam: Pleasant, middle-aged African-American male.  Not in physical distress Skin: No rashes, lesions or ulcers. HEENT: Atraumatic, normocephalic, no obvious bleeding.  Eye patch on the right Lungs: Clear to auscultation bilaterally CVS: Regular rate and rhythm, no murmur GI/Abd soft, nontender, nondistended, bowel sound present CNS: Alert, awake, oriented x3 Psychiatry: Mood appropriate Extremities: No pedal edema, no calf tenderness  Data Review: I have personally reviewed the laboratory data and studies available.  F/u labs ordered Unresulted Labs (From admission, onward)    None       Signed, Terrilee Croak, MD [facility] 07/06/2021

## 2021-07-06 NOTE — Progress Notes (Signed)
Physical Therapy Treatment Patient Details Name: Patrick Carter MRN: [id number] DOB: [birth year] Today's Date: 07/06/2021   History of Present Illness 51 yo male presented to [facility] on 06/26/21 after being found down for 2 hours, had L thalamic hemorrhage. PMH: HTN, HLD, DM2 (uncontrolled).    PT Comments    Patient is able to engage in more complex conversation but will lose track of what he is saying or be unable to convey what he is thinking. He was able to ambulate across the room out into the hallway and perform balance exercise with mod to max assist. On the way back into the room he began experiencing his leg pain more often indicating it may have something to do with his level of fatigue. He still exhibits general strength deficits and requires assistance with transfers and mobility. During treatment session patient showed deficits in strength, endurance, activity tolerance, balance, coordination, and proprioception.  Recommending therapy services at Acute Inpatient Rehab to address the previously stated deficits. Will continue to follow acutely to maximize functional mobility, independence and safety. Physician was made aware of increased leg pain during ambulation.    Recommendations for follow up therapy are one component of a multi-disciplinary discharge planning process, led by the attending physician.  Recommendations may be updated based on patient status, additional functional criteria and insurance authorization.  Follow Up Recommendations  Acute inpatient rehab (3hours/day)     Assistance Recommended at Discharge Intermittent Supervision/Assistance  Patient can return home with the following A lot of help with walking and/or transfers;A lot of help with bathing/dressing/bathroom;Assistance with cooking/housework;Assistance with feeding;Direct supervision/assist for medications management;Direct supervision/assist for financial management;Assist for transportation;Help with stairs  or ramp for entrance   Equipment Recommendations  Rolling walker (2 wheels)    Recommendations for Other Services       Precautions / Restrictions Precautions Precautions: Fall Restrictions Weight Bearing Restrictions: No     Mobility  Bed Mobility Overal bed mobility: Needs Assistance Bed Mobility: Sit to Supine       Sit to supine: Min assist   General bed mobility comments: Needed assistance managing his R LE    Transfers Overall transfer level: Needs assistance Equipment used: Rolling walker (2 wheels) Transfers: Sit to/from Stand, Bed to chair/wheelchair/BSC Sit to Stand: Min assist           General transfer comment: When cued to place his left hand on the chair and his right hand on the walker he requires min guard - min assist depending on his fatigue level. occasionally he will try to pull up on the walker and needs a reminder of how to stand.    Ambulation/Gait Ambulation/Gait assistance: Mod assist Gait Distance (Feet): 30 Feet Assistive device: Rolling walker (2 wheels) Gait Pattern/deviations: Step-to pattern, Decreased stride length, Decreased step length - right, Knees buckling, Decreased weight shift to right, Decreased dorsiflexion - right Gait velocity: decreased         Stairs             Wheelchair Mobility    Modified Rankin (Stroke Patients Only) Modified Rankin (Stroke Patients Only) Pre-Morbid Rankin Score: No symptoms Modified Rankin: Moderately severe disability     Balance Overall balance assessment: Needs assistance Sitting-balance support: Bilateral upper extremity supported, Feet supported Sitting balance-Leahy Scale: Good     Standing balance support: Single extremity supported Standing balance-Leahy Scale: Poor Standing balance comment: Patient is min gaurd when standing stationary with walker. When performing foot taps on a yellow disk her  required mod assits moving to max assist with fatigue.                             Cognition Arousal/Alertness: Awake/alert Behavior During Therapy: Flat affect Overall Cognitive Status: Impaired/Different from baseline Area of Impairment: Attention, Awareness                   Current Attention Level: Sustained   Following Commands: Follows one step commands with increased time, Follows multi-step commands with increased time   Awareness: Emergent   General Comments: Pt is unable to speak some of his responses to more complex questions. He requires increased time for response and sometimes he is completely unable. During conversation he said "I know the words, I can not speak it"        Exercises Other Exercises Other Exercises: standing with walker for UE support, alternating taps on yellow disk. 2 sets of 3 reps Bil    General Comments        Pertinent Vitals/Pain Pain Assessment Pain Assessment: Faces Faces Pain Scale: Hurts a little bit Pain Location: Patients general right sided pain has improved. During ambulation he occasionally experiences severe nerve pain in his foot that shoots up and down his leg. Pain Descriptors / Indicators: Shooting    Home Living                          Prior Function            PT Goals (current goals can now be found in the care plan section)      Frequency    Min 4X/week      PT Plan Current plan remains appropriate    Co-evaluation              AM-PAC PT "6 Clicks" Mobility   Outcome Measure  Help needed turning from your back to your side while in a flat bed without using bedrails?: A Little Help needed moving from lying on your back to sitting on the side of a flat bed without using bedrails?: A Little Help needed moving to and from a bed to a chair (including a wheelchair)?: A Lot Help needed standing up from a chair using your arms (e.g., wheelchair or bedside chair)?: A Lot Help needed to walk in hospital room?: A Lot Help needed climbing 3-5  steps with a railing? : Total 6 Click Score: 13    End of Session Equipment Utilized During Treatment: Gait belt Activity Tolerance: Patient tolerated treatment well;Patient limited by fatigue;Patient limited by pain Patient left: in bed;with call bell/phone within reach;with bed alarm set Nurse Communication: Other (comment) (Communicated with physician on potentially needing medication for nerve pain in R LE) PT Visit Diagnosis: Unsteadiness on feet (R26.81);Muscle weakness (generalized) (M62.81);Other symptoms and signs involving the nervous system (R29.898)     Time: 1135-1200 PT Time Calculation (min) (ACUTE ONLY): 25 min  Charges:  $Gait Training: 8-22 mins $Therapeutic Activity: 8-22 mins                      Armanda Heritage, SPT    Armanda Heritage 07/06/2021, 12:55 PM

## 2021-07-06 NOTE — Progress Notes (Signed)
Inpatient Rehabilitation Admissions Coordinator   I contacted pt's sister, Earma ReadingLillian Isaacs, by phone at [phone]. She has arrived in [place] from [place] yesterday. She will be visiting with her brother later this evening to assess his needs. I will follow up in the am to assist with planning dispo.  Ottie GlazierBarbara Verdis Koval, RN, MSN Rehab Admissions Coordinator [phone] 07/06/2021 12:49 PM

## 2021-07-06 NOTE — Plan of Care (Signed)
Pt worked with Therapy in the hallway and doing well. Cond. Cath in place. Pt sat in the chair for most of his meals today. BM last night.  Problem: Education: Goal: Knowledge of disease or condition will improve 07/06/2021 1748 by Leodis Sias, RN Outcome: Progressing 07/06/2021 1748 by Leodis Sias, RN Outcome: Progressing Goal: Knowledge of secondary prevention will improve (SELECT ALL) 07/06/2021 1748 by Leodis Sias, RN Outcome: Progressing 07/06/2021 1748 by Leodis Sias, RN Outcome: Progressing Goal: Knowledge of patient specific risk factors will improve (INDIVIDUALIZE FOR PATIENT) 07/06/2021 1748 by Leodis Sias, RN Outcome: Progressing 07/06/2021 1748 by Leodis Sias, RN Outcome: Progressing Goal: Individualized Educational Video(s) 07/06/2021 1748 by Leodis Sias, RN Outcome: Progressing 07/06/2021 1748 by Leodis Sias, RN Outcome: Progressing   Problem: Coping: Goal: Will verbalize positive feelings about self 07/06/2021 1748 by Leodis Sias, RN Outcome: Progressing 07/06/2021 1748 by Leodis Sias, RN Outcome: Progressing Goal: Will identify appropriate support needs 07/06/2021 1748 by Leodis Sias, RN Outcome: Progressing 07/06/2021 1748 by Leodis Sias, RN Outcome: Progressing   Problem: Health Behavior/Discharge Planning: Goal: Ability to manage health-related needs will improve 07/06/2021 1748 by Leodis Sias, RN Outcome: Progressing 07/06/2021 1748 by Leodis Sias, RN Outcome: Progressing   Problem: Self-Care: Goal: Ability to participate in self-care as condition permits will improve 07/06/2021 1748 by Leodis Sias, RN Outcome: Progressing 07/06/2021 1748 by Leodis Sias, RN Outcome: Progressing Goal: Verbalization of feelings and concerns over difficulty with self-care will improve 07/06/2021 1748 by Leodis Sias, RN Outcome: Progressing 07/06/2021 1748 by Leodis Sias,  RN Outcome: Progressing Goal: Ability to communicate needs accurately will improve 07/06/2021 1748 by Leodis Sias, RN Outcome: Progressing 07/06/2021 1748 by Leodis Sias, RN Outcome: Progressing   Problem: Nutrition: Goal: Risk of aspiration will decrease 07/06/2021 1748 by Leodis Sias, RN Outcome: Progressing 07/06/2021 1748 by Leodis Sias, RN Outcome: Progressing Goal: Dietary intake will improve 07/06/2021 1748 by Leodis Sias, RN Outcome: Progressing 07/06/2021 1748 by Leodis Sias, RN Outcome: Progressing   Problem: Intracerebral Hemorrhage Tissue Perfusion: Goal: Complications of Intracerebral Hemorrhage will be minimized 07/06/2021 1748 by Leodis Sias, RN Outcome: Progressing 07/06/2021 1748 by Leodis Sias, RN Outcome: Progressing   Problem: Education: Goal: Knowledge of General Education information will improve Description: Including pain rating scale, medication(s)/side effects and non-pharmacologic comfort measures 07/06/2021 1748 by Leodis Sias, RN Outcome: Progressing 07/06/2021 1748 by Leodis Sias, RN Outcome: Progressing   Problem: Health Behavior/Discharge Planning: Goal: Ability to manage health-related needs will improve 07/06/2021 1748 by Leodis Sias, RN Outcome: Progressing 07/06/2021 1748 by Leodis Sias, RN Outcome: Progressing   Problem: Clinical Measurements: Goal: Ability to maintain clinical measurements within normal limits will improve 07/06/2021 1748 by Leodis Sias, RN Outcome: Progressing 07/06/2021 1748 by Leodis Sias, RN Outcome: Progressing Goal: Will remain free from infection 07/06/2021 1748 by Leodis Sias, RN Outcome: Progressing 07/06/2021 1748 by Leodis Sias, RN Outcome: Progressing Goal: Diagnostic test results will improve 07/06/2021 1748 by Leodis Sias, RN Outcome: Progressing 07/06/2021 1748 by Leodis Sias, RN Outcome:  Progressing Goal: Respiratory complications will improve 07/06/2021 1748 by Leodis Sias, RN Outcome: Progressing 07/06/2021 1748 by Leodis Sias, RN Outcome: Progressing Goal: Cardiovascular complication will be avoided 07/06/2021 1748 by Leodis Sias, RN Outcome: Progressing 07/06/2021 1748 by Leodis Sias, RN Outcome: Progressing   Problem: Activity: Goal: Risk for activity intolerance will decrease 07/06/2021 1748 by Leodis Sias, RN Outcome: Progressing 07/06/2021 1748 by Leodis Sias, RN Outcome: Progressing   Problem: Nutrition: Goal: Adequate nutrition will be maintained 07/06/2021 1748 by Leodis Sias,  RN Outcome: Progressing 07/06/2021 1748 by Leodis Siasarpley, Baylin Cabal, RN Outcome: Progressing   Problem: Coping: Goal: Level of anxiety will decrease 07/06/2021 1748 by Leodis Siasarpley, Onnie Alatorre, RN Outcome: Progressing 07/06/2021 1748 by Leodis Siasarpley, Kalanie Fewell, RN Outcome: Progressing   Problem: Elimination: Goal: Will not experience complications related to bowel motility 07/06/2021 1748 by Leodis Siasarpley, Oshua Mcconaha, RN Outcome: Progressing 07/06/2021 1748 by Leodis Siasarpley, Alim Cattell, RN Outcome: Progressing Goal: Will not experience complications related to urinary retention 07/06/2021 1748 by Leodis Siasarpley, Whitaker Holderman, RN Outcome: Progressing 07/06/2021 1748 by Leodis Siasarpley, Kieanna Rollo, RN Outcome: Progressing   Problem: Pain Managment: Goal: General experience of comfort will improve 07/06/2021 1748 by Leodis Siasarpley, Rajohn Henery, RN Outcome: Progressing 07/06/2021 1748 by Leodis Siasarpley, Ainslie Mazurek, RN Outcome: Progressing   Problem: Safety: Goal: Ability to remain free from injury will improve 07/06/2021 1748 by Leodis Siasarpley, Mainor Hellmann, RN Outcome: Progressing 07/06/2021 1748 by Leodis Siasarpley, Rooney Swails, RN Outcome: Progressing   Problem: Skin Integrity: Goal: Risk for impaired skin integrity will decrease 07/06/2021 1748 by Leodis Siasarpley, Kimberlin Scheel, RN Outcome: Progressing 07/06/2021 1748 by Leodis Siasarpley, Stirling Orton,  RN Outcome: Progressing

## 2021-07-07 LAB — GLUCOSE, CAPILLARY
Glucose-Capillary: 116 mg/dL — ABNORMAL HIGH (ref 70–99)
Glucose-Capillary: 191 mg/dL — ABNORMAL HIGH (ref 70–99)
Glucose-Capillary: 162 mg/dL — ABNORMAL HIGH (ref 70–99)
Glucose-Capillary: 94 mg/dL (ref 70–99)

## 2021-07-07 NOTE — Progress Notes (Signed)
PROGRESS NOTE  NICODEMUS SALMI  DOB: Aug 21, 1970  PCP: Patient, No Pcp Per (Inactive) [id number]  DOA: 06/26/2021  LOS: 11 days  Hospital Day: 12  Chief complaint: Found unconscious   Brief narrative: EUGUNE LOCKNER is a 51 y.o. male with PMH significant for DM2, HTN, prior CVA with sequelae, RA on MTX, prednisone, gout, noncompliance to medications. Patient was brought to the ED on 06/26/2021 after he was found unconscious on the floor for about 2 hours. In the ED, he was noted to have right-sided weakness and confusion CTH showed Left thalamic hemorrhage with 47mm left to right midline shift and chronic bilateral basal ganglia lacunar infarcts.  CTA showed no LVO or high grade stenosis of intracranial arteries.  He remained sleepy and confused and hence was admitted to ICU. Transferred out to [facility] on 1/19  Subjective: Patient was seen and examined this morning.   Pleasant middle-aged African-American male.  Lying on bed.  Not in distress.  No new symptoms.  Slow to respond but able to follow commands.  Assessment/Plan: Intracranial hemorrhage -Brought in after he was found on the floor. 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 -Stroke work-up completed. 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   -Current deficits include diplopia for which patient has eyepatch.   -PT/OT eval obtained.  CIR recommended  Dysphagia -Due to stroke  -Currently on dysphagia 3 diet   Essential hypertension -Currently blood pressure in low normal range without meds.   Uncontrolled type 2 diabetes mellitus with hyperglycemia -A1c 11.9 on 06/26/2021 -Home meds include metformin 500 mg daily -Currently on Semglee 10 units daily and  sliding scale insulin.  Blood sugar is consistently over 200. Recent Labs  Lab 07/06/21 1109 07/06/21 1612 07/06/21 2141 07/07/21 0608 07/07/21 1237  GLUCAP 155* 129* 161* 116* 191*   Hyperlipidemia -Continue statins, goal LDL less than 70   Underweight -BMI 17.33 -Encourage po intake as patient appetite has not been great.   Rheumatoid arthritis -On methotrexate and folic acid  Mobility: Encourage ambulation Living condition: Home Goals of care:   Code Status: Full Code  Nutritional status: Body mass index is 17.33 kg/m.      Diet:  Diet Order             DIET DYS 3 Room service appropriate? Yes; Fluid consistency: Thin  Diet effective now                  DVT prophylaxis:  Place and maintain sequential compression device Start: 06/27/21 0006   Antimicrobials: None Fluid: None Consultants: Neurology Family Communication: None at bedside  Status is: Inpatient  Continue in-hospital care because: Pending CIR Level of care: Telemetry Medical   Dispo: The patient is from: Home               Anticipated d/c is to: CIR              Patient currently is not medically stable to d/c.   Difficult to place patient No     Infusions:    Scheduled Meds:  atorvastatin  40 mg Oral QPM   folic acid  1 mg Oral Daily   gabapentin  300 mg Oral BID   insulin aspart  0-15 Units Subcutaneous TID  WC   insulin glargine-yfgn  10 Units Subcutaneous Daily   methotrexate  15 mg Oral Weekly   pantoprazole  40 mg Oral QHS   polyethylene glycol  17 g Oral BID   senna-docusate  1 tablet Oral BID    PRN meds: acetaminophen **OR** acetaminophen (TYLENOL) oral liquid 160 mg/5 mL **OR** acetaminophen, ondansetron   Antimicrobials: Anti-infectives (From admission, onward)    None       Objective: Vitals:   07/07/21 0736 07/07/21 1240  BP: 94/75 108/79  Pulse: 87 87  Resp: 16 17  Temp: 98.3 F (36.8 C) 97.8 F (36.6 C)  SpO2: 97% 99%    Intake/Output  Summary (Last 24 hours) at 07/07/2021 1321 Last data filed at 07/07/2021 0336 Gross per 24 hour  Intake --  Output 900 ml  Net -900 ml   Filed Weights   06/26/21 0700 06/27/21 0500  Weight: 60.1 kg 54.8 kg   Weight change:  Body mass index is 17.33 kg/m.   Physical Exam: General exam: Pleasant, middle-aged African-American male.  Not in physical distress Skin: No rashes, lesions or ulcers. HEENT: Atraumatic, normocephalic, no obvious bleeding.  Eye patch on the right Lungs: Clear to auscultation bilaterally CVS: Regular rate and rhythm, no murmur GI/Abd soft, nontender, nondistended, bowel sound present CNS: Alert, awake, oriented x3 Psychiatry: Mood appropriate Extremities: No pedal edema, no calf tenderness  Data Review: I have personally reviewed the laboratory data and studies available.  F/u labs ordered [employer] (From admission, onward)     Start     Ordered   Signed and Held  Comprehensive metabolic panel  Once,   R       Question:  Specimen collection method  Answer:  Lab=Lab collect   Signed and Held   Signed and Held  CBC WITH DIFFERENTIAL  Once,   R       Question:  Specimen collection method  Answer:  Lab=Lab collect   Signed and Held            Signed, Terrilee Croak, MD [facility] 07/07/2021

## 2021-07-07 NOTE — Progress Notes (Signed)
Speech Language Pathology Treatment: Cognitive-Linquistic  Patient Details Name: Vincenza HewsGarfield S Kawai MRN: [id number] DOB: 07/13/1970 Today's Date: 07/07/2021 Time: [id number] SLP Time Calculation (min) (ACUTE ONLY): 20 min  Assessment / Plan / Recommendation Clinical Impression  Remi DeterSamuel participated in therapy focused on aphasia, verbal and orthographic. He wrote his address with cues needed for zip code. Attempted description and semantic cues this session that were not effective however phrase completion cues. Cognitively he required assist to recall information related to disposition. During divergent naming task pt could not independently name/write items in category without phonemic assist. Remi DeterSamuel was fluent in phrases today and was also mild-moderately drowsy although he is able to make basic needs known. Continue therapy.    HPI HPI: 51 y.o. 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 ICH score of 1(for GCS of 12). Exam with R sided weakness, ataxia, sensory deficit and extinction. Likely had a lacune that bled. Plan is strict BP control, repeat imaging and stroke workup. Not on AC.      SLP Plan  Continue with current plan of care      Recommendations for follow up therapy are one component of a multi-disciplinary discharge planning process, led by the attending physician.  Recommendations may be updated based on patient status, additional functional criteria and insurance authorization.    Recommendations                   Oral Care Recommendations: Oral care BID Follow Up Recommendations: Home health SLP SLP Visit Diagnosis: Aphasia (R47.01) Plan: Continue with current plan of care           Royce MacadamiaLitaker, Anson Peddie Willis  07/07/2021, 1:16 PM

## 2021-07-07 NOTE — H&P (Incomplete)
Physical Medicine and Rehabilitation Admission H&P     HPI: Patrick Carter is a 51 year old right-handed male with history of type 2 diabetes mellitus, rheumatoid arthritis on methotrexate , hypertension, prior CVA without sequela.  Per chart review patient lives alone.  1 level home 3 steps to entry.  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  Presented to [facility] 06/26/2021 after being found down with right side weakness and altered mental status.  Cranial CT scan showed acute left thalamic bleed with a mild amount of white matter edema and approximately 5 mm left to right midline shift.  Chronic bilateral basal ganglia lacunar infarctions.  CT angiogram head and neck no emergent large vessel occlusion or high-grade stenosis.  MRI follow-up showed a 2.7 x 2.5 cm parenchymal hemorrhage centered within the left thalamus and corona radiata stable in size as compared to cranial CT scan.  Mild surrounding edema with local mass-effect partial effacement of the third ventricle.  No evidence of obstructive hydrocephalus.  Admission chemistries unremarkable except glucose 158, WBC 11,600, alcohol negative, ammonia level 26, urine drug screen negative, troponin negative, hemoglobin A1c 11.9.  Echocardiogram with ejection fraction of 55 to 60% no wall motion abnormalities grade 1 diastolic dysfunction.  He was transferred to [facility] for further evaluation.  Initially maintained on Cleviprex drip for monitoring of blood pressure as well as hypertonic saline.  Tolerating a mechanical soft diet.  Therapy evaluations completed due to patient's right side weakness decrease altered mental status was admitted for a comprehensive rehab program.  Review of Systems  Constitutional:  Negative for chills and fever.  HENT:  Negative for hearing loss.   Eyes:  Negative for blurred vision and double vision.  Respiratory:  Negative for cough and shortness of breath.   Cardiovascular:   Negative for chest pain, palpitations and leg swelling.  Gastrointestinal:  Positive for constipation. Negative for heartburn, nausea and vomiting.  Genitourinary:  Negative for dysuria, flank pain and hematuria.  Musculoskeletal:  Positive for joint pain and myalgias.  Skin:  Negative for rash.  Neurological:  Positive for weakness.  All other systems reviewed and are negative. Past Medical History:  Diagnosis Date   Arthritis    Gout    Hypertension    Stroke ([place])    No past surgical history on file. No family history on file. Social History:  reports that he has never smoked. He has never used smokeless tobacco. He reports that he does not currently use alcohol. He reports that he does not currently use drugs. Allergies: No Known Allergies Medications Prior to Admission  Medication Sig Dispense Refill   folic acid (FOLVITE) 1 MG tablet Take 1 mg by mouth daily.     ketorolac (TORADOL) 10 MG tablet Take 1 tablet (10 mg total) by mouth every 6 (six) hours as needed. 20 tablet 0   metFORMIN (GLUCOPHAGE) 500 MG tablet Take 1 tablet (500 mg total) by mouth daily with breakfast. 30 tablet 11   methotrexate (RHEUMATREX) 2.5 MG tablet Take 15 mg by mouth once a week.     ondansetron (ZOFRAN ODT) 4 MG disintegrating tablet Take 1 tablet (4 mg total) by mouth every 8 (eight) hours as needed for nausea or vomiting. 12 tablet 0   predniSONE (DELTASONE) 5 MG tablet Take 5-20 mg by mouth See admin instructions. 20mg  daily for 7 days, then 15mg  daily for 7 days, then 10mg  daily for 7 days, then 5mg  daily for 7 days  predniSONE (DELTASONE) 50 MG tablet Take 1 tablet (50 mg total) by mouth daily with breakfast. (Patient not taking: Reported on 06/26/2021) 5 tablet 0    Drug Regimen Review Drug regimen was reviewed and remains appropriate with no significant issues identified.  Home: Home Living Family/patient expects to be discharged to:: Private residence Living Arrangements:  Alone Available Help at Discharge: Family Type of Home: House Home Access: Stairs to enter Technical brewer of Steps: 3 [place]: One level Bathroom Shower/Tub: Multimedia programmer: Standard Bathroom Accessibility: Yes Home Equipment: None   Functional History: Prior Function Prior Level of Function : Independent/Modified Independent, Working/employed, Driving Mobility Comments: works as Engineer, materials Status:  Mobility: Bed Mobility Overal bed mobility: Needs Assistance Bed Mobility: Sit to Supine Sidelying to sit: Min assist Supine to sit: Mod assist Sit to supine: Min assist General bed mobility comments: Needed assistance managing his R LE Transfers Overall transfer level: Needs assistance Equipment used: Rolling walker (2 wheels) Transfers: Sit to/from Stand, Bed to chair/wheelchair/BSC Sit to Stand: Min assist Bed to/from chair/wheelchair/BSC transfer type:: Stand pivot Stand pivot transfers: Mod assist Step pivot transfers: Mod assist General transfer comment: When cued to place his left hand on the chair and his right hand on the walker he requires min guard - min assist depending on his fatigue level. occasionally he will try to pull up on the walker and needs a reminder of how to stand. Ambulation/Gait Ambulation/Gait assistance: Mod assist Gait Distance (Feet): 30 Feet Assistive device: Rolling walker (2 wheels) Gait Pattern/deviations: Step-to pattern, Decreased stride length, Decreased step length - right, Knees buckling, Decreased weight shift to right, Decreased dorsiflexion - right General Gait Details: Patient exhibited increased knee hyper ext. towrds the end of the session. Gait velocity: decreased    ADL: ADL Overall ADL's : Needs assistance/impaired Eating/Feeding: Set up, Sitting Eating/Feeding Details (indicate cue type and reason): All containers had to be opened for pt. Left self feeding at end of  session Grooming: Wash/dry hands, Wash/dry face, Oral care, Minimal assistance, Standing Grooming Details (indicate cue type and reason): required assistance with toothpaste due to weak RUE Upper Body Bathing: Moderate assistance, Bed level Upper Body Dressing : Moderate assistance, Sitting Upper Body Dressing Details (indicate cue type and reason): changed gown sitting on EOB Lower Body Dressing: Total assistance, Bed level Toilet Transfer: Minimal assistance, +2 for physical assistance, +2 for safety/equipment, Ambulation Toilet Transfer Details (indicate cue type and reason): Able to ambulate to the bathroom with min A +2 for gait assist, balance and proper RW usage General ADL Comments: able to stand during oral care but required seated rest break following and completed grooming seated  Cognition: Cognition Overall Cognitive Status: Impaired/Different from baseline Arousal/Alertness: Awake/alert Orientation Level: Oriented X4 Attention: Sustained Sustained Attention: Appears intact Memory:  (Unable to assess) Awareness: Appears intact Problem Solving:  (Not tested) Cognition Arousal/Alertness: Awake/alert Behavior During Therapy: Flat affect Overall Cognitive Status: Impaired/Different from baseline Area of Impairment: Attention, Awareness Orientation Level: Place, Situation, Time Current Attention Level: Sustained Following Commands: Follows one step commands with increased time, Follows multi-step commands with increased time Awareness: Emergent General Comments: Pt is unable to speak some of his responses to more complex questions. He requires increased time for response and sometimes he is completely unable. During conversation he said "I know the words, I can not speak it"  Physical Exam: Blood pressure 98/73, pulse 86, temperature 98 F (36.7 C), temperature source Oral, resp. rate 17, height 5'  10" (1.778 m), weight 54.8 kg, SpO2 98 %. Physical Exam Neurological:      Comments: Patient prefers to keep his eyes closed.  Follows some simple commands but inconsistent.  Aphasic versus apraxic.    Results for orders placed or performed during the hospital encounter of 06/26/21 (from the past 48 hour(s))  Glucose, capillary     Status: Abnormal   Collection Time: 07/05/21  6:10 AM  Result Value Ref Range   Glucose-Capillary 167 (H) 70 - 99 mg/dL    Comment: Glucose reference range applies only to samples taken after fasting for at least 8 hours.  Glucose, capillary     Status: Abnormal   Collection Time: 07/05/21 11:58 AM  Result Value Ref Range   Glucose-Capillary 197 (H) 70 - 99 mg/dL    Comment: Glucose reference range applies only to samples taken after fasting for at least 8 hours.  Glucose, capillary     Status: Abnormal   Collection Time: 07/05/21  3:37 PM  Result Value Ref Range   Glucose-Capillary 265 (H) 70 - 99 mg/dL    Comment: Glucose reference range applies only to samples taken after fasting for at least 8 hours.  Glucose, capillary     Status: Abnormal   Collection Time: 07/05/21  9:23 PM  Result Value Ref Range   Glucose-Capillary 117 (H) 70 - 99 mg/dL    Comment: Glucose reference range applies only to samples taken after fasting for at least 8 hours.  Glucose, capillary     Status: Abnormal   Collection Time: 07/06/21  6:45 AM  Result Value Ref Range   Glucose-Capillary 135 (H) 70 - 99 mg/dL    Comment: Glucose reference range applies only to samples taken after fasting for at least 8 hours.  Glucose, capillary     Status: Abnormal   Collection Time: 07/06/21 11:09 AM  Result Value Ref Range   Glucose-Capillary 155 (H) 70 - 99 mg/dL    Comment: Glucose reference range applies only to samples taken after fasting for at least 8 hours.  Glucose, capillary     Status: Abnormal   Collection Time: 07/06/21  4:12 PM  Result Value Ref Range   Glucose-Capillary 129 (H) 70 - 99 mg/dL    Comment: Glucose reference range applies only to  samples taken after fasting for at least 8 hours.  Glucose, capillary     Status: Abnormal   Collection Time: 07/06/21  9:41 PM  Result Value Ref Range   Glucose-Capillary 161 (H) 70 - 99 mg/dL    Comment: Glucose reference range applies only to samples taken after fasting for at least 8 hours.   No results found.     Medical Problem List and Plan: 1. Functional deficits secondary to left thalamic IPH secondary to hypertension  -patient may *** shower  -ELOS/Goals: *** 2.  Antithrombotics: -DVT/anticoagulation:  Mechanical: Antiembolism stockings, thigh (TED hose) Bilateral lower extremities  -antiplatelet therapy: N/A 3. Pain Management: Neurontin 300 mg twice daily 4. Mood: Provide emotional support  -antipsychotic agents: N/A 5. Neuropsych: This patient is not capable of making decisions on his own behalf. 6. Skin/Wound Care: Routine skin checks 7. Fluids/Electrolytes/Nutrition: Routine in and outs with follow-up chemistries 8.  Diabetes mellitus.  Hemoglobin A1c 11.9.  Semglee 10 units daily.  Diabetic teaching 9.  Hypertension.  Cleviprex drip discontinued.  Presently on no antihypertensive medications and monitor with increased mobility 10.  Rheumatoid arthritis.  Methotrexate 15 mg weekly.  Patient was not taking  prednisone prior to admission 11.  Hyperlipidemia.  Lipitor. 12.  Constipation.  MiraLAX twice daily/Senokot   Cathlyn Parsons, PA-C 07/07/2021

## 2021-07-07 NOTE — TOC Initial Note (Signed)
Transition of Care [facility]) - Initial/Assessment Note    Patient Details  Name: Patrick Carter MRN: [id number] Date of Birth: [birth year]  [name] CM/SW Contact:    Patrick Balo, RN Phone Number: 07/07/2021, 1:40 PM  Clinical Narrative:                 Recommendations are for CIR. Patients family is all from either out of town or out of state. His sister is here from [place] and is trying to arrange to have him discharge with her to [place]. She is thinking he will be able to stay with their mother but needs to get this all worked out. She states she will need the weekend to get things arranged.  Pt will need walker/ 3 in 1 for home and 30 days worth of medications.  TOC following.  Expected Discharge Plan: Home/Self Care Barriers to Discharge: Inadequate or no insurance   Patient Goals and CMS Choice   CMS Medicare.gov Compare Post Acute Care list provided to:: Patient Represenative (must comment) Choice offered to / list presented to : Sibling  Expected Discharge Plan and Services Expected Discharge Plan: Home/Self Care   Discharge Planning Services: CM Consult Post Acute Care Choice: Durable Medical Equipment Living arrangements for the past 2 months: Single Family Home                 DME Arranged: 3-N-1, Walker rolling                    Prior Living Arrangements/Services Living arrangements for the past 2 months: Single Family Home Lives with:: Roommate Patient language and need for interpreter reviewed:: Yes Do you feel safe going back to the place where you live?: No   Wont have the support he needs  Need for Family Participation in Patient Care: Yes (Comment) Care giver support system in place?: Yes (comment)   Criminal Activity/Legal Involvement Pertinent to Current Situation/Hospitalization: No - Comment as needed  Activities of Daily Living      Permission Sought/Granted                  Emotional  Assessment Appearance:: Appears stated age         Psych Involvement: No (comment)  Admission diagnosis:  ICH (intracerebral hemorrhage) (HCC) [I61.9] Patient Active Problem List   Diagnosis Date Noted   ICH (intracerebral hemorrhage) (HCC) 06/26/2021   Hypertension complicating diabetes (HCC) 06/26/2021   Hypertensive emergency 06/26/2021   Rheumatoid arthritis (HCC) 06/26/2021   PCP:  Patient, No Pcp Per (Inactive) Pharmacy:   [facility] [address] [place] - [address] [place] [address] Phone: [phone] Fax: [phone]     Social Determinants of Health (SDOH) Interventions    Readmission Risk Interventions No flowsheet data found.

## 2021-07-07 NOTE — Progress Notes (Signed)
Inpatient Rehabilitation Admissions Coordinator   I contacted patient's sister, Jonelle Sidle by phone. She and her son visited with patient last night. Her son assisted patient to bathroom as well as to wash while they were visiting. She is assessing if she can take patient home with her to [place] to be his caregiver. She has questions about his ability to travel, his medications, etc. We also discussed cost of CIR as he is uninsured and unable to apply for disability or medicaid without a social security number. She would like to discuss with RN CM and SW of her option to have patient return with her to [place]. Her plans are to return to [place] next Wednesday. She is currently in [place] with her son. I discussed with TOC.  Danne Baxter, RN, MSN Rehab Admissions Coordinator [phone] 07/07/2021 10:57 AM

## 2021-07-08 LAB — GLUCOSE, CAPILLARY
Glucose-Capillary: 111 mg/dL — ABNORMAL HIGH (ref 70–99)
Glucose-Capillary: 144 mg/dL — ABNORMAL HIGH (ref 70–99)
Glucose-Capillary: 154 mg/dL — ABNORMAL HIGH (ref 70–99)
Glucose-Capillary: 127 mg/dL — ABNORMAL HIGH (ref 70–99)

## 2021-07-08 MED ORDER — INSULIN GLARGINE-YFGN 100 UNIT/ML ~~LOC~~ SOLN
10.0000 [IU] | Freq: Every day | SUBCUTANEOUS | Status: DC
  Administered 2021-07-09 – 2021-07-12 (×4): 10 [IU] via SUBCUTANEOUS
  Filled 2021-07-08 (×4): qty 0.1

## 2021-07-08 MED ORDER — METFORMIN HCL 500 MG PO TABS
500.0000 mg | ORAL_TABLET | Freq: Two times a day (BID) | ORAL | Status: DC
  Administered 2021-07-08 – 2021-07-12 (×8): 500 mg via ORAL
  Filled 2021-07-08 (×8): qty 1

## 2021-07-08 NOTE — Progress Notes (Signed)
PROGRESS NOTE  Patrick Carter  DOB: 06/30/1970  PCP: Patient, No Pcp Per (Inactive) [id number]  DOA: 06/26/2021  LOS: 12 days  Hospital Day: 13  Chief complaint: Found unconscious   Brief narrative: Patrick Carter is a 51 y.o. male with PMH significant for DM2, HTN, prior CVA with sequelae, RA on MTX, prednisone, gout, noncompliance to medications. Patient was brought to the ED on 06/26/2021 after he was found unconscious on the floor for about 2 hours. In the ED, he was noted to have right-sided weakness and confusion CTH showed Left thalamic hemorrhage with 48mm left to right midline shift and chronic bilateral basal ganglia lacunar infarcts.  CTA showed no LVO or high grade stenosis of intracranial arteries.  He remained sleepy and confused and hence was admitted to ICU. Transferred out to [facility] on 1/19  Subjective: Patient was seen and examined this morning.   Lying on bed.  Not in distress.  Slow to respond.  Pending placement.  Assessment/Plan: Intracranial hemorrhage -Brought in after he was found on the floor. 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 -Stroke work-up completed. 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   -Current deficits include diplopia for which patient has eyepatch.   -PT/OT eval obtained.  CIR recommended  Dysphagia -Due to stroke  -Currently on dysphagia 3 diet   Essential hypertension -Currently blood pressure in low normal range without meds.   Uncontrolled type 2 diabetes mellitus with hyperglycemia -A1c 11.9 on 06/26/2021 -Home meds include metformin 500 mg daily -Currently on Semglee 10 units daily and sliding scale insulin.  Blood sugar is consistently over 200. -Resume  metformin. Recent Labs  Lab 07/07/21 0608 07/07/21 1237 07/07/21 1603 07/07/21 2138 07/08/21 0616  GLUCAP 116* 191* 162* 94 111*   Hyperlipidemia -Continue statins, goal LDL less than 70   Underweight -BMI 17.33 -Encourage po intake as patient appetite has not been great.   Rheumatoid arthritis -On methotrexate and folic acid  Mobility: Encourage ambulation Living condition: Home Goals of care:   Code Status: Full Code  Nutritional status: Body mass index is 17.33 kg/m.      Diet:  Diet Order             DIET DYS 3 Room service appropriate? Yes; Fluid consistency: Thin  Diet effective now                  DVT prophylaxis:  Place and maintain sequential compression device Start: 06/27/21 0006   Antimicrobials: None Fluid: None Consultants: Neurology Family Communication: None at bedside  Status is: Inpatient  Continue in-hospital care because: Pending CIR Level of care: Telemetry Medical   Dispo: The patient is from: Home               Anticipated d/c is to: CIR versus home with sister              Patient currently is not medically stable to d/c.   Difficult to place patient No     Infusions:    Scheduled Meds:  atorvastatin  40 mg Oral QPM   folic acid  1 mg Oral Daily   gabapentin  300 mg Oral BID   insulin aspart  0-15 Units Subcutaneous TID WC   insulin glargine-yfgn  10 Units Subcutaneous Daily   metFORMIN  500 mg Oral BID WC   methotrexate  15 mg Oral Weekly   pantoprazole  40 mg Oral QHS   polyethylene glycol  17 g Oral BID   senna-docusate  1 tablet Oral BID    PRN meds: acetaminophen **OR** acetaminophen (TYLENOL) oral liquid 160 mg/5 mL **OR** acetaminophen, ondansetron   Antimicrobials: Anti-infectives (From admission, onward)    None       Objective: Vitals:   07/08/21 0401 07/08/21 0816  BP: 98/78 98/78  Pulse: 88 87  Resp: 17 19  Temp: 98.8 F (37.1 C) 98.5 F (36.9 C)  SpO2: 97% 100%    Intake/Output  Summary (Last 24 hours) at 07/08/2021 1001 Last data filed at 07/08/2021 0520 Gross per 24 hour  Intake 240 ml  Output 1900 ml  Net -1660 ml   Filed Weights   06/26/21 0700 06/27/21 0500  Weight: 60.1 kg 54.8 kg   Weight change:  Body mass index is 17.33 kg/m.   Physical Exam: General exam: Pleasant, middle-aged African-American male.  Not in physical distress Skin: No rashes, lesions or ulcers. HEENT: Atraumatic, normocephalic, no obvious bleeding.  Eye patch on the right Lungs: Clear to auscultation bilaterally CVS: Regular rate and rhythm, no murmur GI/Abd soft, nontender, nondistended, bowel sound present CNS: Alert, awake, oriented x3.  Slow to respond but able to answer questions and follow commands Psychiatry: Mood appropriate Extremities: No pedal edema, no calf tenderness  Data Review: I have personally reviewed the laboratory data and studies available.  F/u labs ordered [employer] (From admission, onward)     Start     Ordered   Signed and Held  Comprehensive metabolic panel  Once,   R       Question:  Specimen collection method  Answer:  Lab=Lab collect   Signed and Held   Signed and Held  CBC WITH DIFFERENTIAL  Once,   R       Question:  Specimen collection method  Answer:  Lab=Lab collect   Signed and Held            Signed, Terrilee Croak, MD [facility] 07/08/2021

## 2021-07-09 LAB — GLUCOSE, CAPILLARY
Glucose-Capillary: 103 mg/dL — ABNORMAL HIGH (ref 70–99)
Glucose-Capillary: 165 mg/dL — ABNORMAL HIGH (ref 70–99)
Glucose-Capillary: 131 mg/dL — ABNORMAL HIGH (ref 70–99)
Glucose-Capillary: 114 mg/dL — ABNORMAL HIGH (ref 70–99)

## 2021-07-09 NOTE — Progress Notes (Signed)
PROGRESS NOTE  Patrick Carter  DOB: 03-04-1971  PCP: Patient, No Pcp Per (Inactive) [id number]  DOA: 06/26/2021  LOS: 13 days  Hospital Day: 14  Chief complaint: Found unconscious   Brief narrative: Patrick Carter is a 51 y.o. male with PMH significant for DM2, HTN, prior CVA with sequelae, RA on MTX, prednisone, gout, noncompliance to medications. Patient was brought to the ED on 06/26/2021 after he was found unconscious on the floor for about 2 hours. In the ED, he was noted to have right-sided weakness and confusion CTH showed Left thalamic hemorrhage with 71mm left to right midline shift and chronic bilateral basal ganglia lacunar infarcts.  CTA showed no LVO or high grade stenosis of intracranial arteries.  He remained sleepy and confused and hence was admitted to ICU. Transferred out to [facility] on 1/19  Subjective: Patient was seen and examined this morning.   Lying on bed.  Not in distress.  Slow to respond.  Pending placement.  Assessment/Plan: Intracranial hemorrhage -Brought in after he was found on the floor. 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 -Stroke work-up completed. 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   -Current deficits include diplopia for which patient has eyepatch.   -PT/OT eval obtained.  CIR recommended  Dysphagia -Due to stroke  -Currently on dysphagia 3 diet   Essential hypertension -Currently blood pressure in low normal range without meds.   Uncontrolled type 2 diabetes mellitus with hyperglycemia -A1c 11.9 on 06/26/2021 -Home meds include metformin 500 mg daily -Currently on metformin and Semglee 10 units daily and sliding scale insulin.  Blood sugar improved after  metformin was resumed yesterday.   Recent Labs  Lab 07/08/21 0616 07/08/21 1151 07/08/21 1527 07/08/21 2142 07/09/21 0557  GLUCAP 111* 144* 154* 127* 103*    Hyperlipidemia -Continue statins, goal LDL less than 70   Underweight -BMI 17.33 -Encourage po intake as patient appetite has not been great.   Rheumatoid arthritis -On methotrexate and folic acid  Mobility: Encourage ambulation Living condition: Home Goals of care:   Code Status: Full Code  Nutritional status: Body mass index is 17.33 kg/m.      Diet:  Diet Order             DIET DYS 3 Room service appropriate? Yes; Fluid consistency: Thin  Diet effective now                  DVT prophylaxis:  Place and maintain sequential compression device Start: 06/27/21 0006   Antimicrobials: None Fluid: None Consultants: Neurology Family Communication: None at bedside  Status is: Inpatient  Continue in-hospital care because: Pending CIR Level of care: Telemetry Medical   Dispo: The patient is from: Home               Anticipated d/c is to: CIR versus home with sister              Patient currently is not medically stable to d/c.   Difficult to place patient No     Infusions:    Scheduled Meds:  atorvastatin  40 mg Oral QPM   folic acid  1 mg Oral Daily   gabapentin  300 mg Oral BID   insulin aspart  0-15 Units Subcutaneous TID  WC   insulin glargine-yfgn  10 Units Subcutaneous Daily   metFORMIN  500 mg Oral BID WC   methotrexate  15 mg Oral Weekly   pantoprazole  40 mg Oral QHS   polyethylene glycol  17 g Oral BID   senna-docusate  1 tablet Oral BID    PRN meds: acetaminophen **OR** acetaminophen (TYLENOL) oral liquid 160 mg/5 mL **OR** acetaminophen, ondansetron   Antimicrobials: Anti-infectives (From admission, onward)    None       Objective: Vitals:   07/09/21 0444 07/09/21 0904  BP: 99/74 102/71  Pulse: 89 86  Resp: 18 16  Temp: 98.7 F (37.1 C) 98.4 F (36.9 C)  SpO2:  98% 96%    Intake/Output Summary (Last 24 hours) at 07/09/2021 1024 Last data filed at 07/09/2021 0700 Gross per 24 hour  Intake --  Output 1300 ml  Net -1300 ml    Filed Weights   06/26/21 0700 06/27/21 0500  Weight: 60.1 kg 54.8 kg   Weight change:  Body mass index is 17.33 kg/m.   Physical Exam: General exam: Pleasant, middle-aged African-American male.  Not in physical distress Skin: No rashes, lesions or ulcers. HEENT: Atraumatic, normocephalic, no obvious bleeding.  Eye patch on the right. Lungs: Clear to auscultation bilaterally CVS: Regular rate and rhythm, no murmur GI/Abd soft, nontender, nondistended, bowel sound present CNS: Alert, awake, oriented x3.  Slow to respond but able to answer questions and follow commands Psychiatry: Mood appropriate Extremities: No pedal edema, no calf tenderness  Data Review: I have personally reviewed the laboratory data and studies available.  F/u labs ordered [employer] (From admission, onward)     Start     Ordered   Signed and Held  Comprehensive metabolic panel  Once,   R       Question:  Specimen collection method  Answer:  Lab=Lab collect   Signed and Held   Signed and Held  CBC WITH DIFFERENTIAL  Once,   R       Question:  Specimen collection method  Answer:  Lab=Lab collect   Signed and Held            Signed, Terrilee Croak, MD [facility] 07/09/2021

## 2021-07-10 LAB — GLUCOSE, CAPILLARY
Glucose-Capillary: 100 mg/dL — ABNORMAL HIGH (ref 70–99)
Glucose-Capillary: 205 mg/dL — ABNORMAL HIGH (ref 70–99)
Glucose-Capillary: 76 mg/dL (ref 70–99)
Glucose-Capillary: 96 mg/dL (ref 70–99)

## 2021-07-10 NOTE — Progress Notes (Signed)
Inpatient Rehabilitation Admissions Coordinator   I contacted pt's sister, Jonelle Sidle by phone. She is currently in [place], [place] with her son as she is visiting from [place]. She states family is attempting to arrange caregiver support with her Mom who is also in [place] , but visiting [place] since December with unknown return date. Judeth Porch, sister , requesting patient to be stronger before retuning with her to [place]. I reviewed estimate of cost of care for 2 weeks of Cir and that disability and Medicaid could not be applied for coverage as he does not have a social security number provided to date. I will discusses with our Rehab MD and follow up with acute team tomorrow. I requested that sister, Judeth Porch, does not return to [place] as planned on Wednesday without further discussion with TOC and myself Tuesday with final disposition plans.  Danne Baxter, RN, MSN Rehab Admissions Coordinator [phone] 07/10/2021 6:54 PM

## 2021-07-10 NOTE — Progress Notes (Signed)
Speech Language Pathology Treatment: Cognitive-Linquistic  Patient Details Name: Patrick Carter MRN: [id number] DOB: 1970-09-26 Today's Date: 07/10/2021 Time: 7282-0601 SLP Time Calculation (min) (ACUTE ONLY): 32 min  Assessment / Plan / Recommendation Clinical Impression  Pt seen for ongoing communication intervention.  Pt demonstrated improvement today as compared to last week.  His spontaneous speech is more fluent than targeted/directed word finding during SLP tasks. Pt demonstrates ability to use circumlocution in spontaneous speech; during speech tasks, pt would not engage is descriptive or circumlocutory speech even with prompting, preferring to find the correct word he had in mind.  Pt reports difficulty with "formulating words" more than finding them, suggestive of motor planning involvement.  He was noted with groping x2.  Pt states "I don't know why they won't come out" and expresses frustration with his speech-language deficits.  Pt is very motivated to improve language output.  Today pt completed category naming task generating 5 items in a category with 87% accuracy independently. Pt benefits from additional processing time.  Pt also benefited from semantic cue x1 and phonemic cue x1 with this task.  Pt demonstrated ability to use word finding strategies (initial sound, categories) independently.  With category naming task given 3 examples, pt was 60% accurate.  Pt gave unexpected, but correct answer for one item ("hunters" where "ocean animals" was expected, but all category members, were, in fact, predatory)  Pt benefited from phonemic, but not semantic cues for remaining items.  Pt's speech is clear and free from dysarthria, but low volume and at times monotone.  SLP had to ask for repetition of 1 or 2 items, due to accent ([place]) as well, but pt does not any motor speech deficits.  SLP will continue to follow.  Pt would benefit from ongoing ST at next level of care.    HPI HPI:  51 y.o. 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 ICH score of 1(for GCS of 12). Exam with R sided weakness, ataxia, sensory deficit and extinction. Likely had a lacune that bled. Plan is strict BP control, repeat imaging and stroke workup. Not on AC.      SLP Plan  Continue with current plan of care      Recommendations for follow up therapy are one component of a multi-disciplinary discharge planning process, led by the attending physician.  Recommendations may be updated based on patient status, additional functional criteria and insurance authorization.    Recommendations                   Follow Up Recommendations: Outpatient SLP (Or HH.  ST at next level of care) Assistance recommended at discharge: Intermittent Supervision/Assistance SLP Visit Diagnosis: Aphasia (R47.01) Plan: Continue with current plan of care           Kerrie Pleasure, MA, CCC-SLP Acute Rehabilitation Services Office: [phone]   07/10/2021, 12:01 PM

## 2021-07-10 NOTE — Progress Notes (Signed)
Occupational Therapy Treatment Patient Details Name: Patrick Carter MRN: [id number] DOB: 1970/12/09 Today's Date: 07/10/2021   History of present illness 51 yo male presented to [facility] on 06/26/21 after being found down for 2 hours, had L thalamic hemorrhage. PMH: HTN, HLD, DM2 (uncontrolled).   OT comments  Pt is making good progress toward set goals. Pt is very motivated to work hard.  Pt and family have decided pt will d/c home with family to [place].  Pt does not have insurance at this time but do feel he would benefit from OPOT if insurance situation changes.  Feel pt needs further OT/PT/ST to increase independence with adls, mobility and speech.  Will continue to see inpt as able to focus on adls.    Recommendations for follow up therapy are one component of a multi-disciplinary discharge planning process, led by the attending physician.  Recommendations may be updated based on patient status, additional functional criteria and insurance authorization.    Follow Up Recommendations  Outpatient OT    Assistance Recommended at Discharge Frequent or constant Supervision/Assistance  Patient can return home with the following  A lot of help with walking and/or transfers;A lot of help with bathing/dressing/bathroom;Assistance with cooking/housework;Direct supervision/assist for medications management;Direct supervision/assist for financial management;Assist for transportation;Help with stairs or ramp for entrance   Equipment Recommendations  BSC/3in1;Tub/shower seat    Recommendations for Other Services      Precautions / Restrictions Precautions Precautions: Fall Restrictions Weight Bearing Restrictions: No       Mobility Bed Mobility Overal bed mobility: Needs Assistance Bed Mobility: Sit to Supine   Sidelying to sit: Min assist       General bed mobility comments: Needed assistance managing his R LE    Transfers Overall transfer level: Needs assistance Equipment  used: Rolling walker (2 wheels) Transfers: Sit to/from Stand, Bed to chair/wheelchair/BSC Sit to Stand: Min assist Stand pivot transfers: Min assist         General transfer comment: Reminders given for hand placement and to hold to walker with both hands once up. Pt with posterior lean when first standing until cues given to shift his weight forward.     Balance Overall balance assessment: Needs assistance Sitting-balance support: Bilateral upper extremity supported, Feet supported Sitting balance-Leahy Scale: Good   Postural control: Right lateral lean Standing balance support: Single extremity supported Standing balance-Leahy Scale: Poor Standing balance comment: Patient is min gaurd when standing stationary with walker. When performing foot taps on a yellow disk her required mod assits moving to max assist with fatigue.                           ADL either performed or assessed with clinical judgement   ADL Overall ADL's : Needs assistance/impaired Eating/Feeding: Set up;Sitting;Cueing for safety Eating/Feeding Details (indicate cue type and reason): all containers opened for pt. Pt cued to clear cheek of pocketing. Grooming: Wash/dry hands;Wash/dry face;Oral care;Moderate assistance;Standing Grooming Details (indicate cue type and reason): Pt required cues on how to use RUE as a gross assist due to weakness and required min cues to find objects on the R side.  Pt required mod assist to stand at sink due to heavy R lean. Cues given to use RUE to weightbear when standing to assist with balance.                 Toilet Transfer: Rolling walker (2 wheels);Moderate assistance;Grab bars Toilet Transfer Details (indicate  cue type and reason): Pt walked to bathroom requiring assist to stay inside the walker, keep R hand on walker handle and cues for hand placement when sitting.  Pt is a fall risk without assist. Toileting- Clothing Manipulation and Hygiene: Minimal  assistance;Sitting/lateral lean;Cueing for safety       Functional mobility during ADLs: Moderate assistance;Rolling walker (2 wheels) General ADL Comments: Pt stood at sink for appx 10 min with one rest break.  Pt fatigues quickly and had heavy R lean at end of the 10 minutes.    Extremity/Trunk Assessment Upper Extremity Assessment Upper Extremity Assessment: RUE deficits/detail RUE Deficits / Details: shoulder 2+/5, biceps 4/5, triceps 3+/5, grip 3+/5 RUE Sensation: decreased light touch RUE Coordination: decreased gross motor;decreased fine motor   Lower Extremity Assessment Lower Extremity Assessment: Defer to PT evaluation        Vision   Vision Assessment?: Yes Eye Alignment: Impaired (comment) Ocular Range of Motion: Restricted on the right Alignment/Gaze Preference: Gaze left Tracking/Visual Pursuits: Impaired - to be further tested in functional context Saccades: Additional eye shifts occurred during testing Convergence: Impaired - to be further tested in functional context Visual Fields: Impaired-to be further tested in functional context Diplopia Assessment: Objects split side to side;Disappears with one eye closed Depth Perception: Undershoots   Perception     Praxis Praxis Praxis: Intact    Cognition Arousal/Alertness: Awake/alert Behavior During Therapy: Flat affect Overall Cognitive Status: Impaired/Different from baseline Area of Impairment: Orientation, Attention, Memory, Awareness, Problem solving                 Orientation Level: Time Current Attention Level: Sustained Memory: Decreased recall of precautions, Decreased short-term memory Following Commands: Follows one step commands with increased time, Follows multi-step commands with increased time   Awareness: Emergent Problem Solving: Slow processing, Requires verbal cues General Comments: Pt with some expressive aphasia.  Difficult to assess sometimes vs. this and decreased memory.  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  Pt with significant R intattn causing safety issues and pt unable to problem solve how to use walker when leaving R side behind.        Exercises      Shoulder Instructions       General Comments Pt has excellent potential and works hard on all tasks; motivated.  Pt with significant speech deficits and R side weakness and inattention as well as double vision and R side vision loss.    Pertinent Vitals/ Pain       Pain Assessment Pain Assessment: Faces Faces Pain Scale: Hurts a little bit Pain Location: Pt with pain from R foot that shoots up his leg when walking Pain Descriptors / Indicators: Shooting Pain Intervention(s): Monitored during session, Repositioned  Home Living                                          Prior Functioning/Environment              Frequency  Min 3X/week        Progress Toward Goals  OT Goals(current goals can now be found in the care plan section)  Progress towards OT goals: Progressing toward goals  Acute Rehab OT Goals Patient Stated Goal: to get better so I can cook OT Goal Formulation: With patient Time For Goal Achievement: 07/12/21  Potential to Achieve Goals: Good ADL Goals Pt Will Perform Grooming: sitting;with min guard assist Pt Will Perform Upper Body Bathing: with min guard assist;sitting Pt Will Perform Upper Body Dressing: with min guard assist;sitting Pt Will Transfer to Toilet: bedside commode;stand pivot transfer;with mod assist Additional ADL Goal #1: Pt will demonstrate use of partial occlusion with min VC to reduce symptoms of diplopia and improve functional vision  Plan Discharge plan needs to be updated;Frequency needs to be updated    Co-evaluation                 AM-PAC OT "6 Clicks" Daily Activity     Outcome Measure   Help from another person eating meals?: A Little Help from another  person taking care of personal grooming?: A Little Help from another person toileting, which includes using toliet, bedpan, or urinal?: A Lot Help from another person bathing (including washing, rinsing, drying)?: A Lot Help from another person to put on and taking off regular upper body clothing?: A Lot Help from another person to put on and taking off regular lower body clothing?: A Lot 6 Click Score: 14    End of Session Equipment Utilized During Treatment: Gait belt;Rolling walker (2 wheels)  OT Visit Diagnosis: Unsteadiness on feet (R26.81);Muscle weakness (generalized) (M62.81);Low vision, both eyes (H54.2);Hemiplegia and hemiparesis Hemiplegia - Right/Left: Right Hemiplegia - dominant/non-dominant: Dominant Hemiplegia - caused by: Nontraumatic intracerebral hemorrhage   Activity Tolerance Patient tolerated treatment well   Patient Left in chair;with call bell/phone within reach;with chair alarm set   Nurse Communication Mobility status        Time: 0454-0981 OT Time Calculation (min): 28 min  Charges: OT General Charges $OT Visit: 1 Visit OT Treatments $Self Care/Home Management : 23-37 mins   Hope Budds 07/10/2021, 10:08 AM

## 2021-07-10 NOTE — TOC Progression Note (Signed)
Transition of Care [facility]) - Progression Note    Patient Details  Name: Patrick Carter MRN: [id number] Date of Birth: Aug 27, 1970  [facility] of [facility] CM/SW Contact  Pollie Friar, RN Phone Number: 07/10/2021, 2:14 PM  Clinical Narrative:    CM called and spoke to patients sister, Judeth Porch over the phone. She is now more hesitant on taking him to [place] this week. She feels he needs to be stronger and wants to re-visit CIR. She says he can go and stay with his mother at discharge but would need to be stronger to do this.  CM has asked CIR to reach out to the sister.  TOC following.   Expected Discharge Plan: Home/Self Care Barriers to Discharge: Inadequate or no insurance  Expected Discharge Plan and Services Expected Discharge Plan: Home/Self Care   Discharge Planning Services: CM Consult Post Acute Care Choice: Durable Medical Equipment Living arrangements for the past 2 months: Single Family Home                 DME Arranged: 3-N-1, Walker rolling                     Social Determinants of Health (SDOH) Interventions    Readmission Risk Interventions No flowsheet data found.

## 2021-07-10 NOTE — Progress Notes (Signed)
Physical Therapy Treatment Patient Details Name: Patrick Carter MRN: [id number] DOB: [birth year] Today's Date: 07/10/2021   History of Present Illness 51 yo male presented to [facility] on 06/26/21 after being found down for 2 hours, had L thalamic hemorrhage. PMH: HTN, HLD, DM2 (uncontrolled).    PT Comments    Improving toward goals well.  Still some variability between session, but pt working hard on normalizing function.  Emphasis on standing balance at EOB with rotation, w/shifting and stepping components and progression of gait stability, quality and stamina.   Recommendations for follow up therapy are one component of a multi-disciplinary discharge planning process, led by the attending physician.  Recommendations may be updated based on patient status, additional functional criteria and insurance authorization.  Follow Up Recommendations  Acute inpatient rehab (3hours/day)     Assistance Recommended at Discharge Intermittent Supervision/Assistance  Patient can return home with the following A little help with walking and/or transfers;A little help with bathing/dressing/bathroom;Assistance with cooking/housework;Direct supervision/assist for medications management;Assist for transportation   Equipment Recommendations  Rolling walker (2 wheels)    Recommendations for Other Services Rehab consult     Precautions / Restrictions Precautions Precautions: Fall Precaution Comments: SBP 120-140     Mobility  Bed Mobility Overal bed mobility: Needs Assistance Bed Mobility: Supine to Sit       Sit to supine: Min assist   General bed mobility comments: cued to transition down via L elbow, pull feet in and then roll over.  Needed LE assist    Transfers Overall transfer level: Needs assistance Equipment used: Rolling walker (2 wheels) Transfers: Sit to/from Stand Sit to Stand: Min assist           General transfer comment: cues for hand placement, scooting and then  stability assist and minor assist forward.    Ambulation/Gait Ambulation/Gait assistance: Min assist, Mod assist Gait Distance (Feet): 75 Feet Assistive device: Rolling walker (2 wheels) Gait Pattern/deviations: Step-to pattern, Step-through pattern, Decreased step length - right, Decreased step length - left, Decreased stance time - right, Decreased stride length Gait velocity: decreased Gait velocity interpretation: <1.8 ft/sec, indicate of risk for recurrent falls   General Gait Details: variably short step lengths and unsteadiness.  Worked on improving heel/toe, step width, increasing step lengths, work on balance and translation of hips forward with min to occ mod assist.  Some degadation with muscle fatigue after a certain distance needing for stability and hold for pt to achieve toe off.   Stairs             Wheelchair Mobility    Modified Rankin (Stroke Patients Only) Modified Rankin (Stroke Patients Only) Pre-Morbid Rankin Score: No symptoms Modified Rankin: Moderately severe disability     Balance Overall balance assessment: Needs assistance Sitting-balance support: Feet supported, No upper extremity supported Sitting balance-Leahy Scale: Good     Standing balance support: Single extremity supported, No upper extremity supported, During functional activity Standing balance-Leahy Scale: Poor (toward fair.) Standing balance comment: worked at [employer] for 8+ min or stance, balance, w/shifting and stepping in place to get pt to a place of balance with close min guard.  Unable to take challenge without list posterior and R.                            Cognition Arousal/Alertness: Awake/alert Behavior During Therapy: Flat affect, WFL for tasks assessed/performed Overall Cognitive Status: Impaired/Different from baseline Area of Impairment: Orientation, Attention, Memory,  Awareness, Problem solving                 Orientation Level: Time Current  Attention Level: Sustained Memory: Decreased recall of precautions, Decreased short-term memory Following Commands: Follows one step commands with increased time, Follows multi-step commands with increased time   Awareness: Emergent Problem Solving: Slow processing, Requires verbal cues General Comments: some aphasia, mild inattention that was easy to work with, help call pt's attension to.        Exercises      General Comments General comments (skin integrity, edema, etc.): Agree with this from OT--Pt has excellent potential and works hard on all tasks; motivated.  Pt with significant speech deficits and R side weakness and inattention as well as double vision and R side vision loss.  Could be a great candidate for short, intense rehab stay.      Pertinent Vitals/Pain Pain Assessment Faces Pain Scale: Hurts little more Pain Location: R foot/ankle.with w/bearing/gait Pain Descriptors / Indicators: Shooting Pain Intervention(s): Monitored during session    Home Living                          Prior Function            PT Goals (current goals can now be found in the care plan section) Acute Rehab PT Goals Patient Stated Goal: to feel better PT Goal Formulation: With patient Time For Goal Achievement: 07/12/21 Potential to Achieve Goals: Good Progress towards PT goals: Progressing toward goals    Frequency    Min 4X/week      PT Plan Current plan remains appropriate    Co-evaluation              AM-PAC PT "6 Clicks" Mobility   Outcome Measure  Help needed turning from your back to your side while in a flat bed without using bedrails?: A Little Help needed moving from lying on your back to sitting on the side of a flat bed without using bedrails?: A Little Help needed moving to and from a bed to a chair (including a wheelchair)?: A Lot Help needed standing up from a chair using your arms (e.g., wheelchair or bedside chair)?: A Little Help needed  to walk in hospital room?: A Lot Help needed climbing 3-5 steps with a railing? : A Lot 6 Click Score: 15    End of Session Equipment Utilized During Treatment: Gait belt Activity Tolerance: Patient tolerated treatment well;Patient limited by pain Patient left: in bed;with call bell/phone within reach;with bed alarm set Nurse Communication: Mobility status PT Visit Diagnosis: Unsteadiness on feet (R26.81);Other abnormalities of gait and mobility (R26.89);Muscle weakness (generalized) (M62.81);Other symptoms and signs involving the nervous system (R29.898)     Time: 3151-7616 PT Time Calculation (min) (ACUTE ONLY): 16 min  Charges:  $Gait Training: 8-22 mins                     07/10/2021  Jacinto Halim., PT Acute Rehabilitation Services [phone]  (pager) [phone]  (office)   Eliseo Gum Deolinda Frid 07/10/2021, 2:56 PM

## 2021-07-10 NOTE — Progress Notes (Signed)
PROGRESS NOTE  Patrick Carter  DOB: Jun 08, 1971  PCP: Patient, No Pcp Per (Inactive) [id number]  DOA: 06/26/2021  LOS: 14 days  Hospital Day: 15  Chief complaint: Found unconscious   Brief narrative: Patrick Carter is a 51 y.o. male with PMH significant for DM2, HTN, prior CVA with sequelae, RA on MTX, prednisone, gout, noncompliance to medications. Patient was brought to the ED on 06/26/2021 after he was found unconscious on the floor for about 2 hours. In the ED, he was noted to have right-sided weakness and confusion CTH showed Left thalamic hemorrhage with 49mm left to right midline shift and chronic bilateral basal ganglia lacunar infarcts.  CTA showed no LVO or high grade stenosis of intracranial arteries.  He remained sleepy and confused and hence was admitted to ICU. Transferred out to [facility] on 1/19  Subjective: Patient was seen and examined this morning.   Standing and brushing his teeth with help of physical therapy.  Not in distress  Assessment/Plan: Intracranial hemorrhage -Brought in after he was found on the floor. 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 -Stroke work-up completed. 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   -Current deficits include diplopia for which patient has eyepatch.   -PT/OT eval obtained.  CIR recommended  Dysphagia -Due to stroke  -Currently on dysphagia 3 diet   Essential hypertension -Currently blood pressure in low normal range without meds.   Uncontrolled type 2 diabetes mellitus with hyperglycemia -A1c 11.9 on 06/26/2021 -Home meds include metformin 500 mg daily -Currently on metformin and Semglee 10 units daily and sliding scale insulin.  Blood sugar improved  after metformin was resumed on 1/28.   Recent Labs  Lab 07/09/21 0557 07/09/21 1141 07/09/21 1635 07/09/21 2154 07/10/21 0610  GLUCAP 103* 165* 131* 114* 100*    Hyperlipidemia -Continue statins, goal LDL less than 70   Underweight -BMI 17.33 -Encourage po intake as patient appetite has not been great.   Rheumatoid arthritis -On methotrexate and folic acid  Mobility: Encourage ambulation Living condition: Home Goals of care:   Code Status: Full Code  Nutritional status: Body mass index is 17.33 kg/m.      Diet:  Diet Order             DIET DYS 3 Room service appropriate? Yes; Fluid consistency: Thin  Diet effective now                  DVT prophylaxis:  Place and maintain sequential compression device Start: 06/27/21 0006   Antimicrobials: None Fluid: None Consultants: Neurology Family Communication: None at bedside  Status is: Inpatient  Continue in-hospital care because: Pending arrangement for discharge to home Level of care: Telemetry Medical   Dispo: The patient is from: Home               Anticipated d/c is to: It seems patient's sister will be taking him to [place] with her in next 1 to 2 days              Patient currently is not medically stable to d/c.   Difficult to place patient No     Infusions:    Scheduled Meds:  atorvastatin  40 mg Oral QPM   folic acid  1 mg  Oral Daily   gabapentin  300 mg Oral BID   insulin aspart  0-15 Units Subcutaneous TID WC   insulin glargine-yfgn  10 Units Subcutaneous Daily   metFORMIN  500 mg Oral BID WC   methotrexate  15 mg Oral Weekly   pantoprazole  40 mg Oral QHS   polyethylene glycol  17 g Oral BID   senna-docusate  1 tablet Oral BID    PRN meds: acetaminophen **OR** acetaminophen (TYLENOL) oral liquid 160 mg/5 mL **OR** acetaminophen, ondansetron   Antimicrobials: Anti-infectives (From admission, onward)    None       Objective: Vitals:   07/10/21 0344 07/10/21 0827  BP:  98/74 106/78  Pulse: 89 84  Resp: 16 16  Temp: 99.1 F (37.3 C) 99 F (37.2 C)  SpO2: 98% 96%    Intake/Output Summary (Last 24 hours) at 07/10/2021 1025 Last data filed at 07/10/2021 0347 Gross per 24 hour  Intake 240 ml  Output 300 ml  Net -60 ml    Filed Weights   06/26/21 0700 06/27/21 0500  Weight: 60.1 kg 54.8 kg   Weight change:  Body mass index is 17.33 kg/m.   Physical Exam: General exam: Pleasant, middle-aged African-American male.  Not in physical distress. Skin: No rashes, lesions or ulcers. HEENT: Atraumatic, normocephalic, no obvious bleeding.  Eye patch on the right. Lungs: Clear to auscultation bilaterally CVS: Regular rate and rhythm, no murmur GI/Abd soft, nontender, nondistended, bowel sound present CNS: Alert, awake, oriented x3.  Slow to respond but able to answer questions and follow commands Psychiatry: Mood appropriate Extremities: No pedal edema, no calf tenderness  Data Review: I have personally reviewed the laboratory data and studies available.  F/u labs ordered [employer] (From admission, onward)     Start     Ordered   Signed and Held  Comprehensive metabolic panel  Once,   R       Question:  Specimen collection method  Answer:  Lab=Lab collect   Signed and Held   Signed and Held  CBC WITH DIFFERENTIAL  Once,   R       Question:  Specimen collection method  Answer:  Lab=Lab collect   Signed and Held            Signed, Terrilee Croak, MD [facility] 07/10/2021

## 2021-07-11 ENCOUNTER — Other Ambulatory Visit (HOSPITAL_COMMUNITY): Payer: Self-pay

## 2021-07-11 LAB — GLUCOSE, CAPILLARY
Glucose-Capillary: 112 mg/dL — ABNORMAL HIGH (ref 70–99)
Glucose-Capillary: 100 mg/dL — ABNORMAL HIGH (ref 70–99)
Glucose-Capillary: 113 mg/dL — ABNORMAL HIGH (ref 70–99)
Glucose-Capillary: 114 mg/dL — ABNORMAL HIGH (ref 70–99)

## 2021-07-11 MED ORDER — INSULIN GLARGINE 100 UNIT/ML SOLOSTAR PEN
12.0000 [IU] | PEN_INJECTOR | Freq: Every day | SUBCUTANEOUS | 0 refills | Status: AC
  Filled 2021-07-11: qty 6, 30d supply, fill #0

## 2021-07-11 MED ORDER — METFORMIN HCL 500 MG PO TABS
500.0000 mg | ORAL_TABLET | Freq: Two times a day (BID) | ORAL | 0 refills | Status: AC
Start: 2021-07-11 — End: 2021-08-10
  Filled 2021-07-11: qty 60, 30d supply, fill #0

## 2021-07-11 MED ORDER — BLOOD GLUCOSE MONITOR SYSTEM W/DEVICE KIT
PACK | 0 refills | Status: AC
  Filled 2021-07-11: qty 1, 30d supply, fill #0

## 2021-07-11 MED ORDER — ATORVASTATIN CALCIUM 40 MG PO TABS
40.0000 mg | ORAL_TABLET | Freq: Every evening | ORAL | 0 refills | Status: AC
  Filled 2021-07-11: qty 30, 30d supply, fill #0

## 2021-07-11 MED ORDER — INSULIN PEN NEEDLE 32G X 4 MM MISC
0 refills | Status: AC
  Filled 2021-07-11: qty 100, 30d supply, fill #0

## 2021-07-11 MED ORDER — GABAPENTIN 300 MG PO CAPS
300.0000 mg | ORAL_CAPSULE | Freq: Two times a day (BID) | ORAL | 0 refills | Status: AC
  Filled 2021-07-11: qty 60, 30d supply, fill #0

## 2021-07-11 MED ORDER — ACCU-CHEK SOFTCLIX LANCETS MISC
0 refills | Status: AC
  Filled 2021-07-11: qty 100, 30d supply, fill #0

## 2021-07-11 MED ORDER — GLUCOSE BLOOD VI STRP
ORAL_STRIP | 0 refills | Status: AC
  Filled 2021-07-11: qty 100, 30d supply, fill #0

## 2021-07-11 NOTE — Progress Notes (Signed)
Occupational Therapy Treatment Patient Details Name: Patrick Carter MRN: [id number] DOB: 09/07/1970 Today's Date: 07/11/2021   History of present illness 51 yo male presented to [facility] on 06/26/21 after being found down for 2 hours, had L thalamic hemorrhage. PMH: HTN, HLD, DM2 (uncontrolled).   OT comments  Patient received in bed and was lethargic initially but was able to arouse. Patient able to get to EOB with min assist and increased time. Patient required verbal cues for hand placement to stand and had complaints of right hand pain with walker use. Patient was able to stand at sink for grooming but declined bathing due performed earlier this am but did ask to perform shaving which he performed seated at sink for safety. Patient is making good progress with OT treatment with increased independence with self care. Acute OT to continue to follow.    Recommendations for follow up therapy are one component of a multi-disciplinary discharge planning process, led by the attending physician.  Recommendations may be updated based on patient status, additional functional criteria and insurance authorization.    Follow Up Recommendations  Outpatient OT    Assistance Recommended at Discharge Frequent or constant Supervision/Assistance  Patient can return home with the following  A lot of help with walking and/or transfers;A lot of help with bathing/dressing/bathroom;Assistance with cooking/housework;Direct supervision/assist for medications management;Direct supervision/assist for financial management;Assist for transportation;Help with stairs or ramp for entrance   Equipment Recommendations  BSC/3in1;Tub/shower seat    Recommendations for Other Services      Precautions / Restrictions         Mobility Bed Mobility Overal bed mobility: Needs Assistance Bed Mobility: Supine to Sit     Supine to sit: Min assist     General bed mobility comments: progressing from mod to min assist for  bed mobility    Transfers Overall transfer level: Needs assistance Equipment used: Rolling walker (2 wheels) Transfers: Sit to/from Stand Sit to Stand: Min assist           General transfer comment: verbal cues for hand placement and min assist for walker use     Balance Overall balance assessment: Needs assistance Sitting-balance support: Feet supported, No upper extremity supported Sitting balance-Leahy Scale: Good     Standing balance support: Single extremity supported, No upper extremity supported, During functional activity Standing balance-Leahy Scale: Fair Standing balance comment: stood at sink for grooming tasks                           ADL either performed or assessed with clinical judgement   ADL Overall ADL's : Needs assistance/impaired Eating/Feeding: Set up;Sitting;Cueing for safety Eating/Feeding Details (indicate cue type and reason): assistance with opening containers Grooming: Wash/dry hands;Wash/dry face;Oral care;Minimal assistance;Standing Grooming Details (indicate cue type and reason): assistance with toothpast to open and apply to toothbrush due to pain at right hand                             Functional mobility during ADLs: Moderate assistance;Rolling walker (2 wheels) General ADL Comments: stood at sink to perform grooming with oral care and washing face and hands. Patient shaved self seated with min assist to complete    Extremity/Trunk Assessment Upper Extremity Assessment RUE Deficits / Details: shoulder 2+/5, biceps 4/5, triceps 3+/5, grip 3+/5 RUE Sensation: decreased light touch RUE Coordination: decreased gross motor;decreased fine motor  Vision   Eye Alignment: Impaired (comment) Ocular Range of Motion: Restricted on the right Alignment/Gaze Preference: Gaze left Tracking/Visual Pursuits: Impaired - to be further tested in functional context Saccades: Additional eye shifts occurred during  testing Convergence: Impaired - to be further tested in functional context Visual Fields: Impaired-to be further tested in functional context Diplopia Assessment: Objects split side to side;Disappears with one eye closed Depth Perception: Undershoots   Perception     Praxis      Cognition Arousal/Alertness: Awake/alert Behavior During Therapy: Flat affect, WFL for tasks assessed/performed Overall Cognitive Status: Impaired/Different from baseline Area of Impairment: Orientation, Attention, Memory, Awareness, Problem solving                 Orientation Level: Time Current Attention Level: Sustained Memory: Decreased recall of precautions, Decreased short-term memory Following Commands: Follows one step commands with increased time, Follows multi-step commands with increased time   Awareness: Emergent Problem Solving: Slow processing, Requires verbal cues General Comments: requires cues to be repeated often to follow.        Exercises      Shoulder Instructions       General Comments      Pertinent Vitals/ Pain       Pain Assessment Pain Assessment: Faces Faces Pain Scale: Hurts little more Pain Location: right hand with walker use and when attempting to use functionally Pain Descriptors / Indicators: Aching, Grimacing Pain Intervention(s): Monitored during session, Repositioned  Home Living                                          Prior Functioning/Environment              Frequency  Min 3X/week        Progress Toward Goals  OT Goals(current goals can now be found in the care plan section)  Progress towards OT goals: Progressing toward goals  Acute Rehab OT Goals Patient Stated Goal: get better OT Goal Formulation: With patient Time For Goal Achievement: 07/12/21 Potential to Achieve Goals: Good ADL Goals Pt Will Perform Grooming: sitting;with min guard assist Pt Will Perform Upper Body Bathing: with min guard  assist;sitting Pt Will Perform Upper Body Dressing: with min guard assist;sitting Pt Will Transfer to Toilet: bedside commode;stand pivot transfer;with mod assist Additional ADL Goal #1: Pt will demonstrate use of partial occlusion with min VC to reduce symptoms of diplopia and improve functional vision  Plan Discharge plan needs to be updated;Frequency needs to be updated    Co-evaluation                 AM-PAC OT "6 Clicks" Daily Activity     Outcome Measure   Help from another person eating meals?: A Little Help from another person taking care of personal grooming?: A Little Help from another person toileting, which includes using toliet, bedpan, or urinal?: A Lot Help from another person bathing (including washing, rinsing, drying)?: A Lot Help from another person to put on and taking off regular upper body clothing?: A Lot Help from another person to put on and taking off regular lower body clothing?: A Lot 6 Click Score: 14    End of Session Equipment Utilized During Treatment: Gait belt;Rolling walker (2 wheels)  OT Visit Diagnosis: Unsteadiness on feet (R26.81);Muscle weakness (generalized) (M62.81);Low vision, both eyes (H54.2);Hemiplegia and hemiparesis Hemiplegia - Right/Left: Right Hemiplegia - dominant/non-dominant:  Dominant Hemiplegia - caused by: Nontraumatic intracerebral hemorrhage   Activity Tolerance Patient tolerated treatment well   Patient Left in chair;with call bell/phone within reach;with chair alarm set   Nurse Communication Mobility status        Time: [phone] OT Time Calculation (min): 33 min  Charges: OT General Charges $OT Visit: 1 Visit OT Treatments $Self Care/Home Management : 23-37 mins  Lodema Hong, [place]  Pager [phone] Office [place] 07/11/2021, 10:11 AM

## 2021-07-11 NOTE — Progress Notes (Signed)
Inpatient Rehabilitation Admissions Coordinator   Reviewed case with Dr Naaman Plummer. We feel patient is appropriate for outpatient therapy and to return home with his sister, Judeth Porch, to [place]. I contacted sister, Judeth Porch , by conference call with Denny Peon RN CM. She can take her brother home with her tomorrow to [place]. We will sign off at this time.  Danne Baxter, RN, MSN Rehab Admissions Coordinator [phone] 07/11/2021 12:17 PM

## 2021-07-11 NOTE — TOC Progression Note (Signed)
Transition of Care [facility]) - Progression Note    Patient Details  Name: Patrick Carter MRN: [id number] Date of Birth: 1970/11/06  [facility] CM/SW Contact  Kermit Balo, RN Phone Number: 07/11/2021, 12:12 PM  Clinical Narrative:    CIR has denied patient for rehab. CIR and CM called patients sister and she has agreed to discharge home in am. DME ordered through [employer] and will be delivered to the room. CM will assist with the cost of medications through [facility].  Sister to pick patient up around 9 am tomorrow am. MD updated.    Expected Discharge Plan: Home/Self Care Barriers to Discharge: Inadequate or no insurance  Expected Discharge Plan and Services Expected Discharge Plan: Home/Self Care   Discharge Planning Services: CM Consult Post Acute Care Choice: Durable Medical Equipment Living arrangements for the past 2 months: Single Family Home                 DME Arranged: 3-N-1, Walker rolling                     Social Determinants of Health (SDOH) Interventions    Readmission Risk Interventions No flowsheet data found.

## 2021-07-11 NOTE — Progress Notes (Signed)
PROGRESS NOTE  Patrick Carter  DOB: 1971/02/16  PCP: Patient, No Pcp Per (Inactive) [id number]  DOA: 06/26/2021  LOS: 15 days  Hospital Day: 16  Chief complaint: Found unconscious   Brief narrative: Patrick Carter is a 51 y.o. male with PMH significant for DM2, HTN, prior CVA with sequelae, RA on MTX, prednisone, gout, noncompliance to medications. Patient was brought to the ED on 06/26/2021 after he was found unconscious on the floor for about 2 hours. In the ED, he was noted to have right-sided weakness and confusion CTH showed Left thalamic hemorrhage with 78mm left to right midline shift and chronic bilateral basal ganglia lacunar infarcts.  CTA showed no LVO or high grade stenosis of intracranial arteries.  He remained sleepy and confused and hence was admitted to ICU. Transferred [facility] to [facility] on 1/19  Subjective: Patient was seen and examined this morning.  Sitting in chair.  Not in distress.   Assessment/Plan: Intracranial hemorrhage -Brought in after he was found on the floor. 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 -Stroke work-up completed. 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   -Current deficits include diplopia for which patient has eyepatch.   -PT/OT eval obtained.    Dysphagia -Due to stroke  -Currently on dysphagia 3 diet   Essential hypertension -Currently blood pressure in low normal range without meds.   Uncontrolled type 2 diabetes mellitus with hyperglycemia -A1c 11.9 on 06/26/2021 -Home meds include metformin 500 mg daily -Currently on metformin and Semglee 10 units daily and sliding scale insulin.  Blood sugar improved after metformin was resumed on 1/28.   Recent Labs  Lab  07/10/21 0610 07/10/21 1159 07/10/21 1641 07/10/21 2207 07/11/21 0617  GLUCAP 100* 205* 76 96 112*    Hyperlipidemia -Continue statins, goal LDL less than 70   Underweight -BMI 17.33 -Encourage po intake as patient appetite has not been great.   Rheumatoid arthritis -On methotrexate and folic acid  Mobility: Encourage ambulation Living condition: Home Goals of care:   Code Status: Full Code  Nutritional status: Body mass index is 17.33 kg/m.      Diet:  Diet Order             DIET DYS 3 Room service appropriate? Yes; Fluid consistency: Thin  Diet effective now                  DVT prophylaxis:  Place and maintain sequential compression device Start: 06/27/21 0006   Antimicrobials: None Fluid: None Consultants: Neurology Family Communication: None at bedside  Status is: Inpatient  Continue in-hospital care because: Pending arrangement for discharge to home Level of care: Telemetry Medical   Dispo: The patient is from: Home               Anticipated d/c is to: It seems patient's sister will be taking him to [place] with her in next 1 to 2 days              Patient currently is not medically stable to d/c.   Difficult to place patient No     Infusions:    Scheduled Meds:  atorvastatin  40 mg Oral QPM   folic acid  1 mg Oral Daily   gabapentin  300 mg  Oral BID   insulin aspart  0-15 Units Subcutaneous TID WC   insulin glargine-yfgn  10 Units Subcutaneous Daily   metFORMIN  500 mg Oral BID WC   methotrexate  15 mg Oral Weekly   pantoprazole  40 mg Oral QHS   polyethylene glycol  17 g Oral BID   senna-docusate  1 tablet Oral BID    PRN meds: acetaminophen **OR** acetaminophen (TYLENOL) oral liquid 160 mg/5 mL **OR** acetaminophen, ondansetron   Antimicrobials: Anti-infectives (From admission, onward)    None       Objective: Vitals:   07/11/21 0733 07/11/21 1105  BP: 101/81 108/79  Pulse: 100 93  Resp: 16 17  Temp: 98.9 F (37.2  C) 98.9 F (37.2 C)  SpO2: 98% 98%    Intake/Output Summary (Last 24 hours) at 07/11/2021 1156 Last data filed at 07/11/2021 0435 Gross per 24 hour  Intake --  Output 500 ml  Net -500 ml    Filed Weights   06/26/21 0700 06/27/21 0500  Weight: 60.1 kg 54.8 kg   Weight change:  Body mass index is 17.33 kg/m.   Physical Exam: General exam: Pleasant, middle-aged African-American male.  Not in physical distress. Skin: No rashes, lesions or ulcers. HEENT: Atraumatic, normocephalic, no obvious bleeding.  Eye patch on the right. Lungs: Clear to auscultation bilaterally CVS: Regular rate and rhythm, no murmur GI/Abd soft, nontender, nondistended, bowel sound present CNS: Alert, awake, oriented x3.  Slow to respond but able to answer questions and follow commands Psychiatry: Mood appropriate Extremities: No pedal edema, no calf tenderness  Data Review: I have personally reviewed the laboratory data and studies available.  F/u labs ordered [employer] (From admission, onward)     Start     Ordered   Signed and Held  Comprehensive metabolic panel  Once,   R       Question:  Specimen collection method  Answer:  Lab=Lab collect   Signed and Held   Signed and Held  CBC WITH DIFFERENTIAL  Once,   R       Question:  Specimen collection method  Answer:  Lab=Lab collect   Signed and Held            Signed, Terrilee Croak, MD [facility] 07/11/2021

## 2021-07-11 NOTE — Discharge Summary (Addendum)
Physician Discharge Summary  [name]:[id number] DOB: [birth year] DOA: 06/26/2021  PCP: Patient, No Pcp Per (Inactive)  Admit date: 06/26/2021 Discharge date: 07/12/2021  Admitted From: Home Discharge disposition: Home with DME's   Code Status: Full Code   Discharge Diagnosis:   Principal Problem:   ICH (intracerebral hemorrhage) ([place]) Active Problems:   Hypertension complicating diabetes ([place])   Hypertensive emergency   Rheumatoid arthritis ([place])    Chief complaint: Found unconscious   Brief narrative: Patrick Carter is a 51 y.o. male with PMH significant for DM2, HTN, prior CVA with sequelae, RA on MTX, prednisone, gout, noncompliance to medications. Patient was brought to the ED on 06/26/2021 after he was found unconscious on the floor for about 2 hours. In the ED, he was noted to have right-sided weakness and confusion CTH showed Left thalamic hemorrhage with 22m left to right midline shift and chronic bilateral basal ganglia lacunar infarcts.  CTA showed no LVO or high grade stenosis of intracranial arteries.  He remained sleepy and confused and hence was admitted to ICU. [facility]  Subjective: Patient seen and examined, denies any new complaints. Stable for d/c  Hospital course: Intracranial hemorrhage -Brought in after he was found on the floor. 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 -Stroke work-up completed. -Echo with EF 55 to 683% grade 1 diastolic dysfunction, cannot exclude small PFO -Follow-up CT scan showed unchanged left thymic IPH -A1c 7, LDL less than 70 -Per neurology recommendation, continue statin.   -Current deficits include diplopia for which patient has eyepatch.   -PT/OT eval obtained.    Dysphagia -Due to stroke  -Currently on dysphagia 3 diet   Essential hypertension -Currently blood pressure in low  normal range without meds.   Uncontrolled type 2 diabetes mellitus with hyperglycemia -A1c 11.9 on 06/26/2021 -Home meds include metformin.  -Currently on metformin and Semglee 10 units daily. Blood sugar running consistently less than 150.  Continue the same at home.  Hyperlipidemia -Continue statins, goal LDL less than 70   Underweight -BMI 17.33 -Encourage po intake as patient appetite has not been great.   Rheumatoid arthritis -On methotrexate and folic acid  Mobility: Encourage ambulation Living condition: Home Goals of care:   Code Status: Full Code  Nutritional status: Body mass index is 17.33 kg/m.      Discharge Medications:   Allergies as of 07/12/2021   No Known Allergies      Medication List     STOP taking these medications    ketorolac 10 MG tablet Commonly known as: TORADOL   predniSONE 5 MG tablet Commonly known as: DELTASONE   predniSONE 50 MG tablet Commonly known as: DELTASONE       TAKE these medications    Accu-Chek Guide test strip Generic drug: glucose blood Use up to four times daily as directed   Accu-Chek Guide w/Device Kit Use as directed up to four times daily   Accu-Chek Softclix Lancets lancets Use as directed up to four times daily   atorvastatin 40 MG tablet Commonly known as: LIPITOR Take 1 tablet (40 mg total) by mouth every evening.   Basaglar KwikPen 100 UNIT/ML Inject 12 Units into the skin daily.   folic acid 1 MG tablet Commonly known as: FOLVITE Take 1 mg by mouth daily.   gabapentin 300 MG capsule Commonly known as: NEURONTIN Take 1 capsule (300 mg total) by  mouth 2 (two) times daily.   metFORMIN 500 MG tablet Commonly known as: GLUCOPHAGE Take 1 tablet (500 mg total) by mouth 2 (two) times daily with a meal. What changed: when to take this   methotrexate 2.5 MG tablet Commonly known as: RHEUMATREX Take 15 mg by mouth once a week.   ondansetron 4 MG disintegrating tablet Commonly known as:  Zofran ODT Take 1 tablet (4 mg total) by mouth every 8 (eight) hours as needed for nausea or vomiting.   Pentips 32G X 4 MM Misc Generic drug: Insulin Pen Needle Use as directed up to four times daily               Durable Medical Equipment  (From admission, onward)           Start     Ordered   07/11/21 1212  For home use only DME 3 n 1  Once        07/11/21 1211   07/11/21 1212  For home use only DME Walker rolling  Once       Question Answer Comment  Walker: With Bricelyn   Patient needs a walker to treat with the following condition ICH (intracerebral hemorrhage) ([place])      07/11/21 1211            Wound care:     Discharge Instructions:   Discharge Instructions     Call MD for:  difficulty breathing, headache or visual disturbances   Complete by: As directed    Call MD for:  extreme fatigue   Complete by: As directed    Call MD for:  hives   Complete by: As directed    Call MD for:  persistant dizziness or light-headedness   Complete by: As directed    Call MD for:  persistant nausea and vomiting   Complete by: As directed    Call MD for:  severe uncontrolled pain   Complete by: As directed    Call MD for:  temperature >100.4   Complete by: As directed    Diet Carb Modified   Complete by: As directed    Discharge instructions   Complete by: As directed    Discharge instructions for diabetes mellitus: Check blood sugar 3 times a day and bedtime at home. If blood sugar running above 200 or less than 70 please call your MD to adjust insulin. If you notice signs and symptoms of hypoglycemia (low blood sugar) like jitteriness, confusion, thirst, tremor and sweating, please check blood sugar, drink sugary drink/biscuits/sweets to increase sugar level and call MD or return to ER.    General discharge instructions:  Follow with Primary MD Patient, No Pcp Per (Inactive) in 7 days   Get CBC/BMP checked in next visit within 1 week by PCP or SNF  MD. (We routinely change or add medications that can affect your baseline labs and fluid status, therefore we recommend that you get the mentioned basic workup next visit with your PCP, your PCP may decide not to get them or add new tests based on their clinical decision)  On your next visit with your PCP, please get your medicines reviewed and adjusted.  Please request your PCP  to go over all hospital tests, procedures, radiology results at the follow up, please get all Hospital records sent to your PCP by signing hospital release before you go home.  Activity: As tolerated with Full fall precautions use walker/cane & assistance as needed  Avoid using any recreational substances like cigarette, tobacco, alcohol, or non-prescribed drug.  If you experience worsening of your admission symptoms, develop shortness of breath, life threatening emergency, suicidal or homicidal thoughts you must seek medical attention immediately by calling 911 or calling your MD immediately  if symptoms less severe.  You must read complete instructions/literature along with all the possible adverse reactions/side effects for all the medicines you take and that have been prescribed to you. Take any new medicine only after you have completely understood and accepted all the possible adverse reactions/side effects.   Do not drive, operate heavy machinery, perform activities at heights, swimming or participation in water activities or provide baby sitting services if your were admitted for syncope or siezures until you have seen by Primary MD or a Neurologist and advised to do so again.  Do not drive when taking Pain medications.  Do not take more than prescribed Pain, Sleep and Anxiety Medications  Wear Seat belts while driving.  Please note You were cared for by a hospitalist during your hospital stay. If you have any questions about your discharge medications or the care you received while you were in the hospital  after you are discharged, you can call the unit and asked to speak with the hospitalist on call if the hospitalist that took care of you is not available. Once you are discharged, your primary care physician will handle any further medical issues. Please note that NO REFILLS for any discharge medications will be authorized once you are discharged, as it is imperative that you return to your primary care physician (or establish a relationship with a primary care physician if you do not have one) for your aftercare needs so that they can reassess your need for medications and monitor your lab values.   Increase activity slowly   Complete by: As directed        Follow ups:    Follow-up [facility] Follow up.   Contact information: [place] [id number] [phone]                Discharge Exam:   Vitals:   07/11/21 1241 07/11/21 1510 07/11/21 2039 07/12/21 0713  BP: 107/83 109/86 (!) 118/91 113/81  Pulse: 89 89 93 94  Resp: _0 Temp:  98.8 F (37.1 C) 98.6 F (37 C) 97.7 F (36.5 C)  TempSrc:  Oral Oral Oral  SpO2: 98% 97% 97% 96%  Weight:      Height:        Body mass index is 17.33 kg/m.   General exam: Pleasant, middle-aged African-American male.  Not in physical distress. Skin: No rashes, lesions or ulcers. HEENT: Atraumatic, normocephalic, no obvious bleeding.  Eye patch on the right. Lungs: Clear to auscultation bilaterally CVS: Regular rate and rhythm, no murmur GI/Abd soft, nontender, nondistended, bowel sound present CNS: Alert, awake, oriented x3.  Slow to respond but able to answer questions and follow commands Psychiatry: Mood appropriate Extremities: No pedal edema, no calf tenderness  Time coordinating discharge: 35 minutes   The results of significant diagnostics from this hospitalization (including imaging, microbiology, ancillary and laboratory) are listed  below for reference.    Procedures and Diagnostic Studies:   CT HEAD WO CONTRAST  Result Date: 06/26/2021 CLINICAL DATA:  Stroke follow-up. EXAM: CT HEAD WITHOUT CONTRAST TECHNIQUE: Contiguous axial images were obtained from the base of the skull through  the vertex without intravenous contrast. RADIATION DOSE REDUCTION: This exam was performed according to the departmental dose-optimization program which includes automated exposure control, adjustment of the mA and/or kV according to patient size and/or use of iterative reconstruction technique. COMPARISON:  CT head from same day. FINDINGS: Brain: Unchanged left thalamic hemorrhage with 3 mm left-to-right midline shift. No intraventricular extension or new hemorrhage. No evidence of hydrocephalus, extra-axial collection or mass lesion/mass effect. Old lacunar infarct in the left basal ganglia again noted. Vascular: No hyperdense vessel or unexpected calcification. Skull: Normal. Negative for fracture or focal lesion. Sinuses/Orbits: No acute finding. Other: None. IMPRESSION: 1. Unchanged left thalamic hemorrhage with 3 mm left-to-right midline shift. No intraventricular extension or new hemorrhage. Electronically Signed   By: Titus Dubin M.D.   On: 06/26/2021 08:47   CT HEAD WO CONTRAST (5MM)  Result Date: 06/26/2021 CLINICAL DATA:  Altered mental status. EXAM: CT HEAD WITHOUT CONTRAST TECHNIQUE: Contiguous axial images were obtained from the base of the skull through the vertex without intravenous contrast. RADIATION DOSE REDUCTION: This exam was performed according to the departmental dose-optimization program which includes automated exposure control, adjustment of the mA and/or kV according to patient size and/or use of iterative reconstruction technique. COMPARISON:  None. FINDINGS: Brain: There is mild cerebral atrophy with widening of the extra-axial spaces and ventricular dilatation. There are areas of decreased attenuation within the white  matter tracts of the supratentorial brain, consistent with microvascular disease changes. Chronic bilateral basal ganglia lacunar infarcts are seen. A 2.3 cm x 2.1 cm x 3.7 cm acute left thalamic bleed is seen. There is a mild amount of white matter edema with a approximately 5 mm left to right midline shift (axial CT image 13, CT series 2). Vascular: No hyperdense vessel or unexpected calcification. Skull: Normal. Negative for fracture or focal lesion. Sinuses/Orbits: No acute finding. Other: None. IMPRESSION: 1. Acute left thalamic bleed with a mild amount of white matter edema and approximately 5 mm left to right midline shift. 2. Chronic bilateral basal ganglia lacunar infarcts. Electronically Signed   By: Virgina Norfolk M.D.   On: 06/26/2021 02:11   MR BRAIN W WO CONTRAST  Result Date: 06/26/2021 CLINICAL DATA:  Provided history: Transient ischemic attack (TIA); stroke, follow-up; neuro deficit, acute, stroke suspected. EXAM: MRI HEAD WITHOUT AND WITH CONTRAST TECHNIQUE: Multiplanar, multiecho pulse sequences of the brain and surrounding structures were obtained without and with intravenous contrast. CONTRAST:  71m GADAVIST GADOBUTROL 1 MMOL/ML IV SOLN COMPARISON:  Head CT examinations performed earlier today 06/26/2021. CT angiogram head/neck 06/26/2021. FINDINGS: Brain: Cerebral volume appears normal for age. Redemonstrated 2.7 x 2.5 cm acute parenchymal hemorrhage centered within the left thalamus and corona radiata. Mild surrounding edema, similar to slightly increased. As before, there is local mass effect with partial effacement of the third ventricle. No midline shift measured at the septum pellucidum. No evidence of obstructive hydrocephalus or intraventricular extension of hemorrhage at this time. No suspicious masslike or nodular enhancement at the hemorrhage site. Mild multifocal T2 FLAIR hyperintense abnormality elsewhere within the cerebral white matter, nonspecific but compatible with  chronic small vessel ischemic disease. Small T2 hyperintense foci within the bilateral basal ganglia have appearance most suggestive of prominent perivascular spaces. No extra-axial fluid collection. Vascular: Maintained flow voids within the proximal large arterial vessels. Skull and upper cervical spine: Focal suspicious marrow lesion. Sinuses/Orbits: Visualized orbits show no acute finding. Mild mucosal thickening within the left frontal and bilateral ethmoid sinuses. Small mucous retention cysts within  the right maxillary sinus. Trace mucosal thickening within the left maxillary sinus. IMPRESSION: 2.7 x 2.5 cm acute parenchymal hemorrhage centered within the left thalamus and corona radiata, stable in size as compared to the head CT performed earlier today at 8:34 a.m. Mild surrounding edema, similar to slightly increased. As before, there is local mass effect with partial effacement of the third ventricle. No evidence of obstructive hydrocephalus or intraventricular extension of hemorrhage at this time. No suspicious masslike or nodular enhancement at the hemorrhage site. Background mild chronic small vessel ischemic changes within the cerebral white matter. Paranasal sinus disease, as described. Electronically Signed   By: Kellie Simmering D.O.   On: 06/26/2021 17:28   DG CHEST PORT 1 VIEW  Result Date: 06/27/2021 CLINICAL DATA:  Fever, history of brain bleed EXAM: PORTABLE CHEST 1 VIEW COMPARISON:  12/20/2020 FINDINGS: No focal consolidation. No pleural effusion or pneumothorax. Heart and mediastinal contours are unremarkable. No acute osseous abnormality. IMPRESSION: No active disease. Electronically Signed   By: Kathreen Devoid M.D.   On: 06/27/2021 09:02   ECHOCARDIOGRAM COMPLETE  Result Date: 06/26/2021    ECHOCARDIOGRAM REPORT   Patient Name:   RION CATALA Date of Exam: 06/26/2021 Medical Rec #:  [id number]          Height:       70.0 in Accession #:    [id number]         Weight:       132.5 lb  Date of Birth:  05/06/1971          BSA:          1.752 m Patient Age:    51 years           BP:           122/92 mmHg Patient Gender: M                  HR:           96 bpm. Exam Location:  Inpatient Procedure: 2D Echo, 3D Echo, Cardiac Doppler, Color Doppler and Strain Analysis Indications:    Stroke I63.9  History:        Patient has no prior history of Echocardiogram examinations.                 Stroke; Risk Factors:Hypertension. Gout.  Sonographer:    Darlina Sicilian RDCS Referring Phys: [id number] [place]  1. Left ventricular ejection fraction, by estimation, is 55 to 60%. Left ventricular ejection fraction by 3D volume is 57 %. The left ventricle has normal function. The left ventricle has no regional wall motion abnormalities. There is mild left ventricular hypertrophy. Left ventricular diastolic parameters are consistent with Grade I diastolic dysfunction (impaired relaxation).  2. Right ventricular systolic function is low normal. The right ventricular size is normal. Tricuspid regurgitation signal is inadequate for assessing PA pressure.  3. The mitral valve is abnormal. No evidence of mitral valve regurgitation.  4. The aortic valve is tricuspid. Aortic valve regurgitation is not visualized.  5. The inferior vena cava is normal in size with greater than 50% respiratory variability, suggesting right atrial pressure of 3 mmHg.  6. Cannot exclude a small PFO. Comparison(s): No prior Echocardiogram. Conclusion(s)/Recommendation(s): Cannot exclude ASD/PFO. Consider transesophageal echocardiogram and/or bubble study if clinically indicated. FINDINGS  Left Ventricle: Left ventricular ejection fraction, by estimation, is 55 to 60%. Left ventricular ejection fraction by 3D volume is 57 %. The left ventricle has normal function.  The left ventricle has no regional wall motion abnormalities. The left ventricular internal cavity size was normal in size. There is mild left ventricular hypertrophy.  Left ventricular diastolic parameters are consistent with Grade I diastolic dysfunction (impaired relaxation). Indeterminate filling pressures. Right Ventricle: The right ventricular size is normal. No increase in right ventricular wall thickness. Right ventricular systolic function is low normal. Tricuspid regurgitation signal is inadequate for assessing PA pressure. Left Atrium: Left atrial size was normal in size. Right Atrium: Right atrial size was normal in size. Pericardium: There is no evidence of pericardial effusion. Mitral Valve: The mitral valve is abnormal. There is mild thickening of the posterior and anterior mitral valve leaflet(s). No evidence of mitral valve regurgitation. Tricuspid Valve: The tricuspid valve is grossly normal. Tricuspid valve regurgitation is trivial. Aortic Valve: The aortic valve is tricuspid. Aortic valve regurgitation is not visualized. Pulmonic Valve: The pulmonic valve was grossly normal. Pulmonic valve regurgitation is trivial. Aorta: The aortic root and ascending aorta are structurally normal, with no evidence of dilitation. Venous: The inferior vena cava is normal in size with greater than 50% respiratory variability, suggesting right atrial pressure of 3 mmHg. IAS/Shunts: Cannot exclude a small PFO.  LEFT VENTRICLE PLAX 2D LVIDd:         3.60 cm         Diastology LVIDs:         2.20 cm         LV e' medial:    8.05 cm/s LV PW:         1.10 cm         LV E/e' medial:  6.6 LV IVS:        1.10 cm         LV e' lateral:   14.60 cm/s LVOT diam:     2.20 cm         LV E/e' lateral: 3.7 LV SV:         63 LV SV Index:   36 LVOT Area:     3.80 cm        3D Volume EF                                LV 3D EF:    Left                                             ventricul                                             ar                                             ejection                                             fraction  by 3D                                              volume is                                             57 %.                                 3D Volume EF:                                3D EF:        57 %                                LV EDV:       104 ml                                LV ESV:       45 ml                                LV SV:        60 ml RIGHT VENTRICLE RV S prime:     9.90 cm/s TAPSE (M-mode): 2.2 cm LEFT ATRIUM             Index        RIGHT ATRIUM          Index LA diam:        2.50 cm 1.43 cm/m   RA Area:     8.63 cm LA Vol (A2C):   38.6 ml 22.03 ml/m  RA Volume:   12.90 ml 7.36 ml/m LA Vol (A4C):   26.5 ml 15.12 ml/m LA Biplane Vol: 33.0 ml 18.83 ml/m  AORTIC VALVE LVOT Vmax:   90.80 cm/s LVOT Vmean:  69.700 cm/s LVOT VTI:    0.167 m  AORTA Ao Root diam: 3.40 cm Ao Asc diam:  3.60 cm MITRAL VALVE MV Area (PHT):  4.06 cm   SHUNTS MV Area (plan): 6.54 cm   Systemic VTI:  0.17 m MV Decel Time:  187 msec   Systemic Diam: 2.20 cm MV E velocity: 53.45 cm/s MV A velocity: 57.90 cm/s MV E/A ratio:  0.92 Lyman Bishop MD Electronically signed by Lyman Bishop MD Signature Date/Time: 06/26/2021/11:18:05 AM    Final    CT ANGIO HEAD NECK W WO CM (CODE STROKE)  Result Date: 06/26/2021 CLINICAL DATA:  Altered mental status.  Intracranial hemorrhage. EXAM: CT ANGIOGRAPHY HEAD AND NECK TECHNIQUE: Multidetector CT imaging of the head and neck was performed using the standard protocol during bolus administration of intravenous contrast. Multiplanar CT image reconstructions and MIPs were obtained to evaluate the vascular anatomy. Carotid stenosis measurements (when applicable) are obtained utilizing NASCET criteria, using the distal internal carotid diameter as the denominator. RADIATION DOSE REDUCTION: This exam was performed according to the departmental dose-optimization program  which includes automated exposure control, adjustment of the mA and/or kV according to patient size and/or use of iterative  reconstruction technique. CONTRAST:  76m OMNIPAQUE IOHEXOL 350 MG/ML SOLN COMPARISON:  None. FINDINGS: Unchanged left thalamic intraparenchymal hematoma. CTA NECK FINDINGS SKELETON: There is no bony spinal canal stenosis. No lytic or blastic lesion. OTHER NECK: Normal pharynx, larynx and major salivary glands. No cervical lymphadenopathy. Unremarkable thyroid gland. UPPER CHEST: No pneumothorax or pleural effusion. No nodules or masses. AORTIC ARCH: There is no calcific atherosclerosis of the aortic arch. There is no aneurysm, dissection or hemodynamically significant stenosis of the visualized portion of the aorta. Conventional 3 vessel aortic branching pattern. The visualized proximal subclavian arteries are widely patent. RIGHT CAROTID SYSTEM: Normal without aneurysm, dissection or stenosis. LEFT CAROTID SYSTEM: Normal without aneurysm, dissection or stenosis. VERTEBRAL ARTERIES: Left dominant configuration. Both origins are clearly patent. There is no dissection, occlusion or flow-limiting stenosis to the skull base (V1-V3 segments). CTA HEAD FINDINGS POSTERIOR CIRCULATION: --Vertebral arteries: Normal V4 segments. --Inferior cerebellar arteries: Normal. --Basilar artery: Normal. --Superior cerebellar arteries: Normal. --Posterior cerebral arteries (PCA): Normal. ANTERIOR CIRCULATION: --Intracranial internal carotid arteries: Normal. --Anterior cerebral arteries (ACA): Normal. Both A1 segments are present. Patent anterior communicating artery (a-comm). --Middle cerebral arteries (MCA): Normal. VENOUS SINUSES: As permitted by contrast timing, patent. ANATOMIC VARIANTS: None Review of the MIP images confirms the above findings. IMPRESSION: 1. No emergent large vessel occlusion or high-grade stenosis of the intracranial arteries. 2. Unchanged left thalamic intraparenchymal hematoma. Electronically Signed   By: KUlyses JarredM.D.   On: 06/26/2021 03:51     Labs:   Basic Metabolic Panel: No results for  input(s): NA, K, CL, CO2, GLUCOSE, BUN, CREATININE, CALCIUM, MG, PHOS in the last 168 hours. GFR Estimated Creatinine Clearance: 77.9 mL/min (by C-G formula based on SCr of 0.87 mg/dL). Liver Function Tests: No results for input(s): AST, ALT, ALKPHOS, BILITOT, PROT, ALBUMIN in the last 168 hours. No results for input(s): LIPASE, AMYLASE in the last 168 hours. No results for input(s): AMMONIA in the last 168 hours. Coagulation profile No results for input(s): INR, PROTIME in the last 168 hours.  CBC: No results for input(s): WBC, NEUTROABS, HGB, HCT, MCV, PLT in the last 168 hours. Cardiac Enzymes: No results for input(s): CKTOTAL, CKMB, CKMBINDEX, TROPONINI in the last 168 hours. BNP: Invalid input(s): POCBNP CBG: Recent Labs  Lab 07/11/21 0617 07/11/21 1217 07/11/21 1550 07/11/21 2042 07/12/21 0904  GLUCAP 112* 100* 113* 114* 92   D-Dimer No results for input(s): DDIMER in the last 72 hours. Hgb A1c No results for input(s): HGBA1C in the last 72 hours. Lipid Profile No results for input(s): CHOL, HDL, LDLCALC, TRIG, CHOLHDL, LDLDIRECT in the last 72 hours. Thyroid function studies No results for input(s): TSH, T4TOTAL, T3FREE, THYROIDAB in the last 72 hours.  Invalid input(s): FREET3 Anemia work up No results for input(s): VITAMINB12, FOLATE, FERRITIN, TIBC, IRON, RETICCTPCT in the last 72 hours. Microbiology No results found for this or any previous visit (from the past 240 hour(s)).   Signed: NAlma Friendly [facility] 07/12/2021, 11:09 AM

## 2021-07-11 NOTE — Progress Notes (Signed)
Physical Therapy Treatment Patient Details Name: Patrick Carter MRN: [id number] DOB: 07-15-1970 Today's Date: 07/11/2021   History of Present Illness 51 yo male presented to [facility] on 06/26/21 after being found down for 2 hours, had L thalamic hemorrhage. PMH: HTN, HLD, DM2 (uncontrolled).    PT Comments    Pt with increased lethargy this date, reports of headache and what appeared to be worsening R sided weakness. Pt unable to hold onto walker with R UE, more lethargic requiring constant verbal cues to maintain eyes open, and increased difficulty sequencing and required more assist today. Pt only able to amb to the door and back today with tactile cues at R hip flexor and modA to advance R LE in addition to assist to hold R UE on RW. Pt reports "I just feel different". RN notified and came in to assess pt. BP 108/92, sugar 100. Acute PT to cont to follow.   Recommendations for follow up therapy are one component of a multi-disciplinary discharge planning process, led by the attending physician.  Recommendations may be updated based on patient status, additional functional criteria and insurance authorization.  Follow Up Recommendations  Acute inpatient rehab (3hours/day)     Assistance Recommended at Discharge Intermittent Supervision/Assistance  Patient can return home with the following A little help with walking and/or transfers;A little help with bathing/dressing/bathroom;Assistance with cooking/housework;Direct supervision/assist for medications management;Assist for transportation   Equipment Recommendations  Rolling walker (2 wheels)    Recommendations for Other Services Rehab consult     Precautions / Restrictions Precautions Precautions: Fall Precaution Comments: R hemi, BP soft 108/92 Restrictions Weight Bearing Restrictions: No     Mobility  Bed Mobility Overal bed mobility: Needs Assistance Bed Mobility: Supine to Sit     Supine to sit: Mod assist     General  bed mobility comments: pt with increased difficulty today, slow processing/sequencing, hard time moving R LE off EOB, didn't use R UE functionally, modA to scoot hips to EOB and modA for trunk elevation    Transfers Overall transfer level: Needs assistance Equipment used: Rolling walker (2 wheels) Transfers: Sit to/from Stand Sit to Stand: Mod assist           General transfer comment: tactile cues at R Knee to engage during stand, increased time, labored effort, modA to power up    Ambulation/Gait Ambulation/Gait assistance: Min assist, Mod assist Gait Distance (Feet): 20 Feet Assistive device: Rolling walker (2 wheels) Gait Pattern/deviations: Step-to pattern, Step-through pattern, Decreased step length - right, Decreased step length - left, Decreased stance time - right, Decreased stride length Gait velocity: decreased Gait velocity interpretation: <1.8 ft/sec, indicate of risk for recurrent falls   General Gait Details: pt with short step height and length, stomping pattern, modA for walker management, tactile cues at R hip flexors to help clear R foot as pt was dragging/slidding R foot initially, pt kept stopping and closing eyes even with glasses on with small posterior lean requiring constant v/c's to stay awake, pt amb to the door and then walked back to bed to return to supine   Stairs             Wheelchair Mobility    Modified Rankin (Stroke Patients Only) Modified Rankin (Stroke Patients Only) Pre-Morbid Rankin Score: No symptoms Modified Rankin: Moderately severe disability     Balance Overall balance assessment: Needs assistance Sitting-balance support: Feet supported, No upper extremity supported Sitting balance-Leahy Scale: Good Sitting balance - Comments: initally patient demonstrated right  lateral lean but was able to correct with verbal cues Postural control: Right lateral lean Standing balance support: Single extremity supported, No upper  extremity supported, During functional activity Standing balance-Leahy Scale: Fair Standing balance comment: pt lethargic, requiring minA for static standing today                            Cognition Arousal/Alertness: Awake/alert Behavior During Therapy: Flat affect (required max verbal cues to keep eyes open, even in standing/walking) Overall Cognitive Status: Impaired/Different from baseline Area of Impairment: Orientation, Attention, Memory, Awareness, Problem solving                 Orientation Level: Time Current Attention Level: Focused Memory: Decreased recall of precautions, Decreased short-term memory Following Commands: Follows one step commands with increased time   Awareness: Intellectual Problem Solving: Slow processing, Requires verbal cues, Requires tactile cues General Comments: pt very lethargic today compared to previous sessions, pt sleeping upon arrival, easily awoken but kept closing eyes t/o session, even in standing/walking        Exercises      General Comments General comments (skin integrity, edema, etc.): BP 108/92, sugar 100      Pertinent Vitals/Pain Pain Assessment Pain Assessment: Faces Faces Pain Scale: Hurts little more Pain Location: headache Pain Descriptors / Indicators: Headache Pain Intervention(s):  (reported to RN)    Home Living                          Prior Function            PT Goals (current goals can now be found in the care plan section) Acute Rehab PT Goals PT Goal Formulation: With patient Time For Goal Achievement: 07/12/21 Potential to Achieve Goals: Good Progress towards PT goals: Not progressing toward goals - comment (pt reports "I feel different", R sided worsening weakness)    Frequency    Min 4X/week      PT Plan Current plan remains appropriate    Co-evaluation              AM-PAC PT "6 Clicks" Mobility   Outcome Measure  Help needed turning from your back  to your side while in a flat bed without using bedrails?: A Little Help needed moving from lying on your back to sitting on the side of a flat bed without using bedrails?: A Little Help needed moving to and from a bed to a chair (including a wheelchair)?: A Lot Help needed standing up from a chair using your arms (e.g., wheelchair or bedside chair)?: A Lot Help needed to walk in hospital room?: A Lot Help needed climbing 3-5 steps with a railing? : A Lot 6 Click Score: 14    End of Session Equipment Utilized During Treatment: Gait belt Activity Tolerance: Patient limited by lethargy Patient left: in bed;with call bell/phone within reach;with bed alarm set Nurse Communication: Mobility status PT Visit Diagnosis: Unsteadiness on feet (R26.81);Other abnormalities of gait and mobility (R26.89);Muscle weakness (generalized) (M62.81);Other symptoms and signs involving the nervous system (R29.898)     Time: 1203-1228 PT Time Calculation (min) (ACUTE ONLY): 25 min  Charges:  $Gait Training: 8-22 mins $Therapeutic Activity: 8-22 mins                     Lewis Shock, PT, DPT Acute Rehabilitation Services Pager #: [phone] Office #: [phone]    [facility]  M Seraiah Nowack 07/11/2021, 1:34 PM

## 2021-07-12 LAB — GLUCOSE, CAPILLARY
Glucose-Capillary: 92 mg/dL (ref 70–99)
Glucose-Capillary: 150 mg/dL — ABNORMAL HIGH (ref 70–99)

## 2021-07-12 NOTE — Progress Notes (Signed)
Occupational Therapy Treatment Patient Details Name: Patrick Carter MRN: [id number] DOB: 1970/10/27 Today's Date: 07/12/2021   History of present illness 51 yo male presented to [facility] on 06/26/21 after being found down for 2 hours, had L thalamic hemorrhage. PMH: HTN, HLD, DM2 (uncontrolled).   OT comments  Patient received in bed and agreeable to OT session. Patient was able to get to EOB with min assist for trunk to complete. Patient ambulated to sink and stood for grooming, UB bathing, and bathing peri area.  Patient performed dressing seated with education to donn right side first.  Patient has made good progress with OT treatment and is expected to bed discharged today to home with family.    Recommendations for follow up therapy are one component of a multi-disciplinary discharge planning process, led by the attending physician.  Recommendations may be updated based on patient status, additional functional criteria and insurance authorization.    Follow Up Recommendations  Outpatient OT    Assistance Recommended at Discharge Frequent or constant Supervision/Assistance  Patient can return home with the following  A lot of help with walking and/or transfers;A lot of help with bathing/dressing/bathroom;Assistance with cooking/housework;Direct supervision/assist for medications management;Direct supervision/assist for financial management;Assist for transportation;Help with stairs or ramp for entrance   Equipment Recommendations  BSC/3in1;Tub/shower seat    Recommendations for Other Services      Precautions / Restrictions Precautions Precautions: Fall Precaution Comments: R hemi Restrictions Weight Bearing Restrictions: No       Mobility Bed Mobility Overal bed mobility: Needs Assistance Bed Mobility: Supine to Sit     Supine to sit: Min assist     General bed mobility comments: required assistance with trunk to get to EOB    Transfers Overall transfer level: Needs  assistance Equipment used: Rolling walker (2 wheels) Transfers: Sit to/from Stand Sit to Stand: Min assist     Step pivot transfers: Min assist     General transfer comment: min assist to power up and with walker use for transfers.     Balance Overall balance assessment: Needs assistance Sitting-balance support: Feet supported, No upper extremity supported Sitting balance-Leahy Scale: Good Sitting balance - Comments: able to maintain balance sitting on EOB   Standing balance support: Single extremity supported, Bilateral upper extremity supported Standing balance-Leahy Scale: Fair Standing balance comment: singe extremity support when standing at sink for self care                           ADL either performed or assessed with clinical judgement   ADL Overall ADL's : Needs assistance/impaired Eating/Feeding: Set up;Sitting;Cueing for safety Eating/Feeding Details (indicate cue type and reason): assistance with opening containers Grooming: Wash/dry hands;Wash/dry face;Oral care;Minimal assistance;Standing Grooming Details (indicate cue type and reason): assistance with toothpaste Upper Body Bathing: Minimal assistance Upper Body Bathing Details (indicate cue type and reason): required assistance to bathe LUE.  Attempted to use RUE to bathe LUE and could not complete Lower Body Bathing: Sit to/from stand;Sitting/lateral leans;Minimal assistance Lower Body Bathing Details (indicate cue type and reason): used LUE to bathe LB seated and standing at sink Upper Body Dressing : Moderate assistance;Sitting Upper Body Dressing Details (indicate cue type and reason): education on donning RUE first Lower Body Dressing: Moderate assistance;Sit to/from stand Lower Body Dressing Details (indicate cue type and reason): required assistance to feed RLE into pants and was able to donn LLE and required assistance to pull up  General ADL Comments: performed grooming,  UB bathing, and bathing peri area standing at sink.  Dressing performed seated except standing to pullup clothing    Extremity/Trunk Assessment Upper Extremity Assessment RUE Deficits / Details: shoulder 2+/5, biceps 4/5, triceps 3+/5, grip 3+/5 RUE Sensation: decreased light touch RUE Coordination: decreased gross motor;decreased fine motor            Vision   Eye Alignment: Impaired (comment) Ocular Range of Motion: Restricted on the right Alignment/Gaze Preference: Gaze left Tracking/Visual Pursuits: Impaired - to be further tested in functional context Saccades: Additional eye shifts occurred during testing Convergence: Impaired - to be further tested in functional context Visual Fields: Impaired-to be further tested in functional context Diplopia Assessment: Objects split side to side;Disappears with one eye closed Depth Perception: Undershoots   Perception     Praxis      Cognition Arousal/Alertness: Awake/alert Behavior During Therapy: Flat affect Overall Cognitive Status: Impaired/Different from baseline Area of Impairment: Orientation, Attention, Memory, Awareness, Problem solving                 Orientation Level: Time Current Attention Level: Focused Memory: Decreased recall of precautions, Decreased short-term memory Following Commands: Follows one step commands with increased time   Awareness: Intellectual Problem Solving: Slow processing, Requires verbal cues, Requires tactile cues General Comments: aware of potential discharge and going to [place] with sister        Exercises      Shoulder Instructions       General Comments      Pertinent Vitals/ Pain       Pain Assessment Pain Assessment: Faces Faces Pain Scale: Hurts little more Pain Location: right hand during RW use and functional use Pain Descriptors / Indicators: Aching, Discomfort, Grimacing Pain Intervention(s): Monitored during session, Repositioned  Home Living                                           Prior Functioning/Environment              Frequency  Min 3X/week        Progress Toward Goals  OT Goals(current goals can now be found in the care plan section)  Progress towards OT goals: Progressing toward goals  Acute Rehab OT Goals Patient Stated Goal: get better OT Goal Formulation: With patient Time For Goal Achievement: 07/12/21 Potential to Achieve Goals: Good ADL Goals Pt Will Perform Grooming: sitting;with min guard assist Pt Will Perform Upper Body Bathing: with min guard assist;sitting Pt Will Perform Upper Body Dressing: with min guard assist;sitting Pt Will Transfer to Toilet: bedside commode;stand pivot transfer;with mod assist Additional ADL Goal #1: Pt will demonstrate use of partial occlusion with min VC to reduce symptoms of diplopia and improve functional vision  Plan Discharge plan needs to be updated;Frequency needs to be updated    Co-evaluation                 AM-PAC OT "6 Clicks" Daily Activity     Outcome Measure   Help from another person eating meals?: A Little Help from another person taking care of personal grooming?: A Little Help from another person toileting, which includes using toliet, bedpan, or urinal?: A Lot Help from another person bathing (including washing, rinsing, drying)?: A Lot Help from another person to put on and taking off regular upper body clothing?: A  Lot Help from another person to put on and taking off regular lower body clothing?: A Lot 6 Click Score: 14    End of Session Equipment Utilized During Treatment: Gait belt;Rolling walker (2 wheels)  OT Visit Diagnosis: Unsteadiness on feet (R26.81);Muscle weakness (generalized) (M62.81);Low vision, both eyes (H54.2);Hemiplegia and hemiparesis Hemiplegia - Right/Left: Right Hemiplegia - dominant/non-dominant: Dominant Hemiplegia - caused by: Nontraumatic intracerebral hemorrhage   Activity Tolerance Patient  tolerated treatment well   Patient Left in chair;with call bell/phone within reach;with chair alarm set   Nurse Communication Mobility status        Time: 1610-9604 OT Time Calculation (min): 36 min  Charges: OT General Charges $OT Visit: 1 Visit OT Treatments $Self Care/Home Management : 23-37 mins  Alfonse Flavors, OTA Acute Rehabilitation Services  Pager [phone] Office [phone]   Dewain Penning 07/12/2021, 12:08 PM

## 2021-07-12 NOTE — Plan of Care (Signed)
  Problem: Coping: Goal: Will verbalize positive feelings about self Outcome: Progressing   Problem: Coping: Goal: Will identify appropriate support needs Outcome: Progressing   Problem: Health Behavior/Discharge Planning: Goal: Ability to manage health-related needs will improve Outcome: Progressing   

## 2021-07-12 NOTE — Progress Notes (Addendum)
Physical Therapy Treatment Patient Details Name: Patrick Carter MRN: [id number] DOB: January 18, 1971 Today's Date: 07/12/2021   History of Present Illness 51 yo male presented to [facility] on 06/26/21 after being found down for 2 hours, had L thalamic hemorrhage. PMH: HTN, HLD, DM2 (uncontrolled).    PT Comments    Pt much improved from yesterday. Pt able to amb 120' with RW. Pt with continued R sided weakness, limited R grip on walker and difficulty consistently clearing R LE t/o ambulation. Stair negotiation was completed. Call sister Gardiner Ramus to instruct on proper sequencing. Will meet with her when she arrives to hospital to demonstrate. Pt continues with flat affect. Acute PT to cont to follow.  Pt's sister returned to hospital. At 1225pm. PT provided demonstration on how assist her brother on the stairs. Sister with good understanding and feels comfortable assisting pt.    Recommendations for follow up therapy are one component of a multi-disciplinary discharge planning process, led by the attending physician.  Recommendations may be updated based on patient status, additional functional criteria and insurance authorization.  Follow Up Recommendations  Acute inpatient rehab (3hours/day)     Assistance Recommended at Discharge Intermittent Supervision/Assistance  Patient can return home with the following A little help with walking and/or transfers;A little help with bathing/dressing/bathroom;Assistance with cooking/housework;Direct supervision/assist for medications management;Assist for transportation   Equipment Recommendations  Rolling walker (2 wheels)    Recommendations for Other Services Rehab consult     Precautions / Restrictions Precautions Precautions: Fall Precaution Comments: R hemi Restrictions Weight Bearing Restrictions: No     Mobility  Bed Mobility               General bed mobility comments: pt received sitting up in chair    Transfers Overall transfer  level: Needs assistance Equipment used: Rolling walker (2 wheels) Transfers: Sit to/from Stand Sit to Stand: Min assist           General transfer comment: verbal cues for safe hand placement, steadies self on chair with back of legs until he can get his hands up on walker    Ambulation/Gait Ambulation/Gait assistance: Min assist Gait Distance (Feet): 120 Feet Assistive device: Rolling walker (2 wheels) Gait Pattern/deviations: Step-to pattern, Step-through pattern, Decreased step length - right, Decreased step length - left, Decreased stance time - right, Decreased stride length Gait velocity: decreased Gait velocity interpretation: <1.8 ft/sec, indicate of risk for recurrent falls   General Gait Details: pt much improved from yesterday. Pt able to clear R foot today with increased time majority of time. with onset of fatigue pt with scuffing of R foot, pt with one episode of R knee buckling requiring minA to prevent fall. Pt with loose grip on RW with R hand but didn't let his hand fall off walker, significant increase in time   Stairs Stairs: Yes Stairs assistance: Min assist Stair Management: One rail Right, Step to pattern, Sideways Number of Stairs: 2 (x2) General stair comments: verbal cues to seqence "up with the good (L), down with the bad (R)". instructed to use R hand rail only with sideways technique for optimal support.   Wheelchair Mobility    Modified Rankin (Stroke Patients Only) Modified Rankin (Stroke Patients Only) Pre-Morbid Rankin Score: No symptoms Modified Rankin: Moderately severe disability     Balance Overall balance assessment: Needs assistance Sitting-balance support: Feet supported, No upper extremity supported Sitting balance-Leahy Scale: Good Sitting balance - Comments: able to maintain balance sitting on EOB Postural control: Right  lateral lean Standing balance support: Bilateral upper extremity supported, During functional  activity Standing balance-Leahy Scale: Fair                              Cognition Arousal/Alertness: Awake/alert Behavior During Therapy: Flat affect Overall Cognitive Status: Impaired/Different from baseline Area of Impairment: Orientation, Attention, Memory, Awareness, Problem solving                 Orientation Level: Time Current Attention Level: Focused Memory: Decreased recall of precautions, Decreased short-term memory Following Commands: Follows one step commands with increased time   Awareness: Intellectual Problem Solving: Slow processing, Requires verbal cues, Requires tactile cues General Comments: aware he is leaving today with sister, pt remains to have delayed processing and dificulty with sequencing requiring freq verbal cues        Exercises      General Comments General comments (skin integrity, edema, etc.): vss      Pertinent Vitals/Pain Pain Assessment Pain Assessment: Faces Faces Pain Scale: No hurt Pain Location: denies headache    Home Living                          Prior Function            PT Goals (current goals can now be found in the care plan section) Acute Rehab PT Goals PT Goal Formulation: With patient Time For Goal Achievement: 07/26/21 Potential to Achieve Goals: Good Progress towards PT goals: Progressing toward goals    Frequency    Min 4X/week      PT Plan Current plan remains appropriate    Co-evaluation              AM-PAC PT "6 Clicks" Mobility   Outcome Measure  Help needed turning from your back to your side while in a flat bed without using bedrails?: A Little Help needed moving from lying on your back to sitting on the side of a flat bed without using bedrails?: A Little Help needed moving to and from a bed to a chair (including a wheelchair)?: A Lot Help needed standing up from a chair using your arms (e.g., wheelchair or bedside chair)?: A Lot Help needed to walk in  hospital room?: A Lot Help needed climbing 3-5 steps with a railing? : A Lot 6 Click Score: 14    End of Session Equipment Utilized During Treatment: Gait belt Activity Tolerance: Patient limited by lethargy Patient left: in bed;with call bell/phone within reach;with chair alarm set;in chair Nurse Communication: Mobility status PT Visit Diagnosis: Unsteadiness on feet (R26.81);Other abnormalities of gait and mobility (R26.89);Muscle weakness (generalized) (M62.81);Other symptoms and signs involving the nervous system (R29.898)     Time: [id number] PT Time Calculation (min) (ACUTE ONLY): 20 min  Charges:  $Gait Training: 8-22 mins                     Lewis ShockAshly Kashmir Lysaght, PT, DPT Acute Rehabilitation Services Pager #: [phone] Office #: [phone]    Iona Hansenshly M Nabilah Davoli 07/12/2021, 12:22 PM

## 2021-07-12 NOTE — TOC Transition Note (Signed)
Transition of Care [facility]) - CM/SW Discharge Note   Patient Details  Name: Patrick Carter MRN: [id number] Date of Birth: 1970-08-24  [facility] of [facility]) CM/SW Contact:  Kermit Balo, RN Phone Number: 07/12/2021, 10:13 AM   Clinical Narrative:    Patient is discharging home with his sister to [place]. DME for home is at the bedside. Medications for home covered with [facility] program and delivered to the room per [facility] pharmacy.  Sister providing needed transport.    Final next level of care: Home/Self Care Barriers to Discharge: Inadequate or no insurance, Barriers Unresolved (comment)   Patient Goals and CMS Choice   CMS Medicare.gov Compare Post Acute Care list provided to:: Patient Represenative (must comment) Choice offered to / list presented to : Sibling  Discharge Placement                       Discharge Plan and Services   Discharge Planning Services: CM Consult Post Acute Care Choice: Durable Medical Equipment          DME Arranged: 3-N-1, Walker rolling                    Social Determinants of Health (SDOH) Interventions     Readmission Risk Interventions No flowsheet data found.

## 2021-07-12 NOTE — Progress Notes (Signed)
Discharge instructions discussed with patient and sister. All questions answered. Iv removed, tele discontinued. Pt clothing, eye glasses, and TOC meds sent with patient. Pt wheeled off unit for transport home by sister.   Melony Overly, RN

## 2022-11-14 IMAGING — CT CT HEAD W/O CM
3 of 5 series · 13 of 47 positions shown, 15 images · non-contrast
Comparison: Brain MRI 06/26/2021 and earlier.

CLINICAL DATA: 50-year-old male with altered mental status
presenting with left thalamic hemorrhage on 06/26/2021. Subsequent
encounter.



[Series 4: head without · axial · non-contrast · 0.46mm/px · z∈[-103,-68]mm · 2 of 37 slices shown]
[im 8/37  brain]
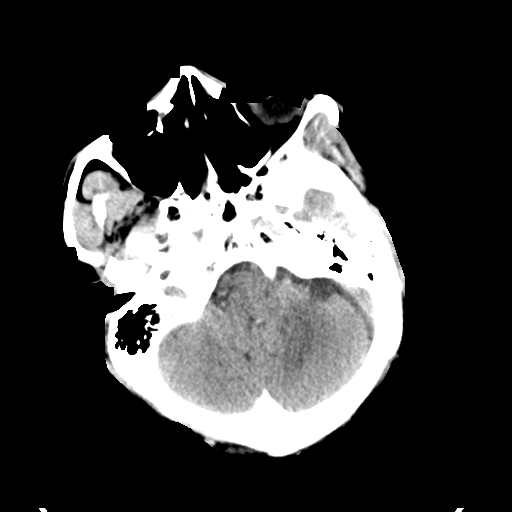
[im 15/37  brain]
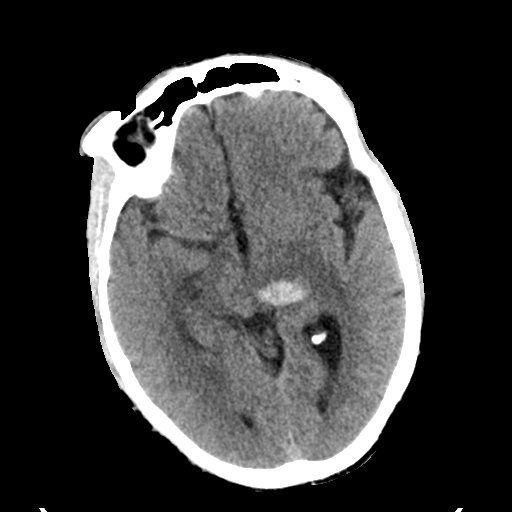

[Series 5: head without cor · coronal · non-contrast · 0.36mm/px · 3 of 76 slices shown]
[im 26/76  brain]
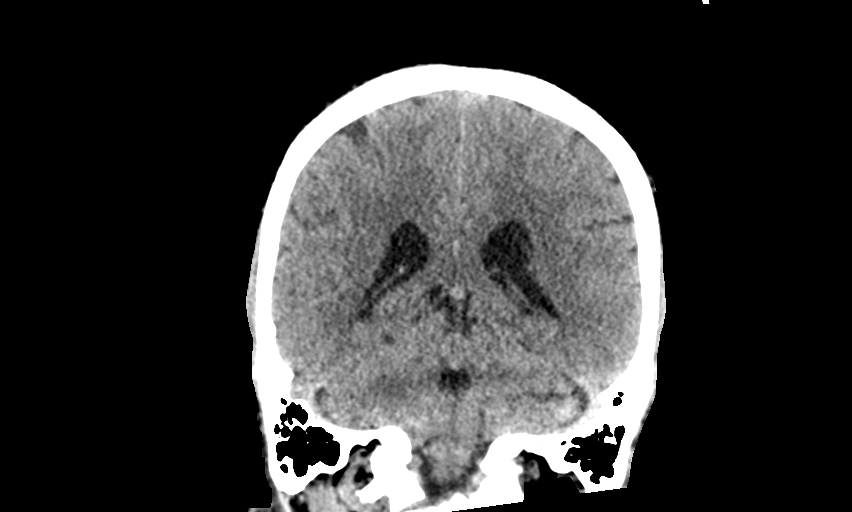
[im 34/76  brain]
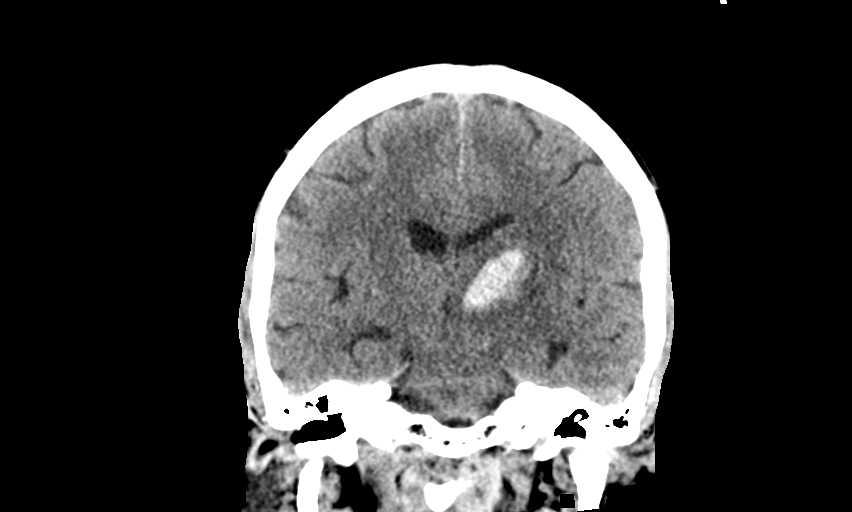
[im 42/76  brain]
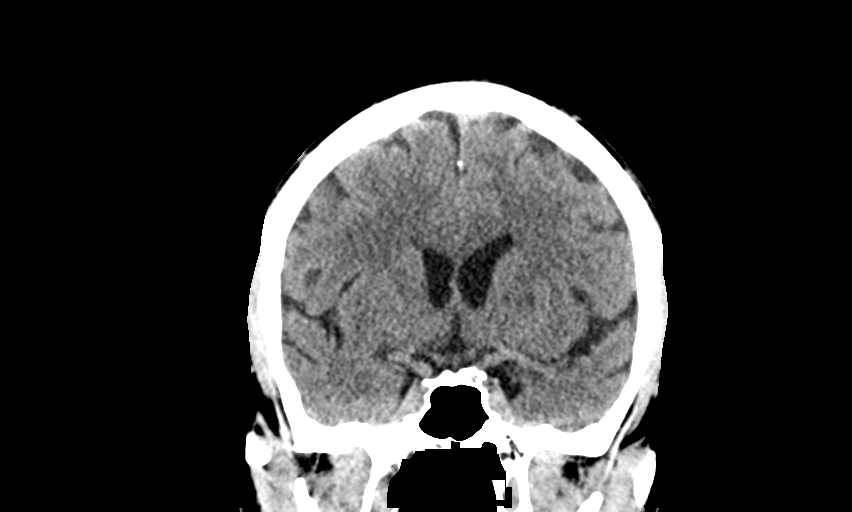

[Series 7: head without axial straight · axial · non-contrast · 0.44mm/px · z∈[-79,+66]mm · 8 of 66 slices shown, 10 images]
[im 8/66  brain]
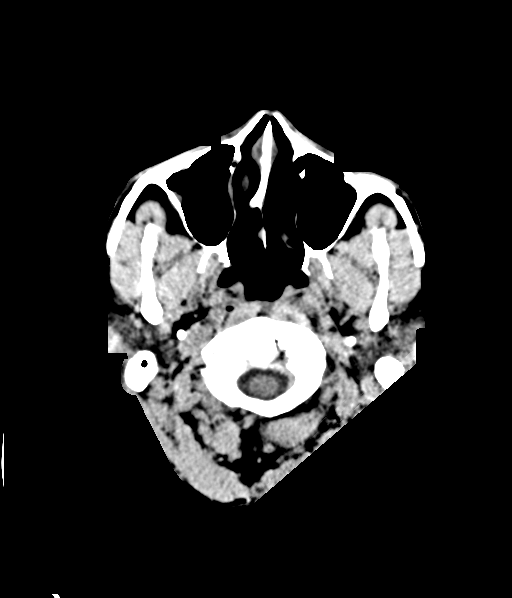
[im 8/66  bone]
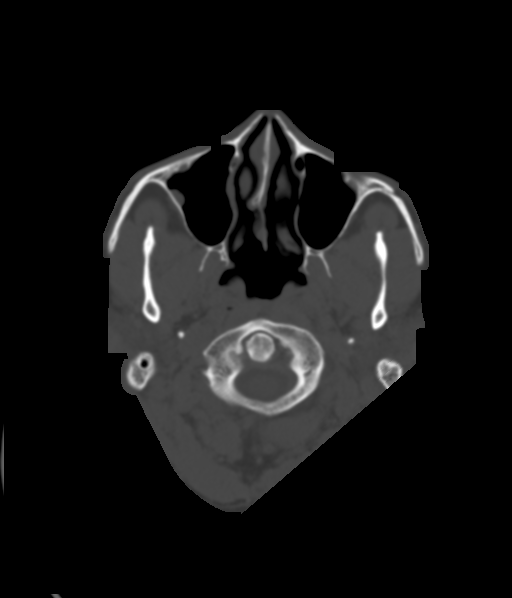
[im 15/66  brain]
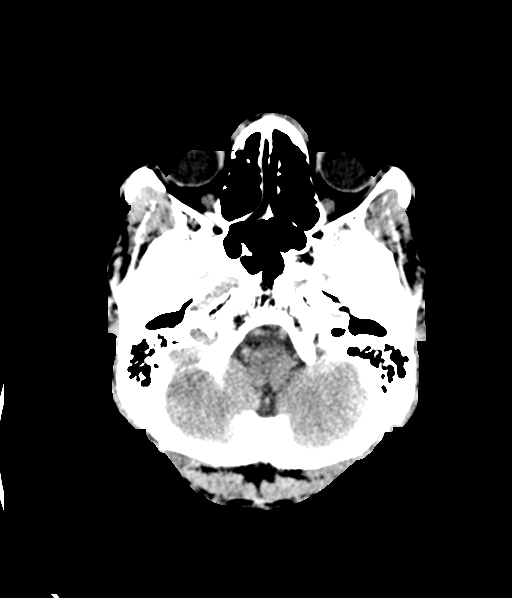
[im 22/66  brain]
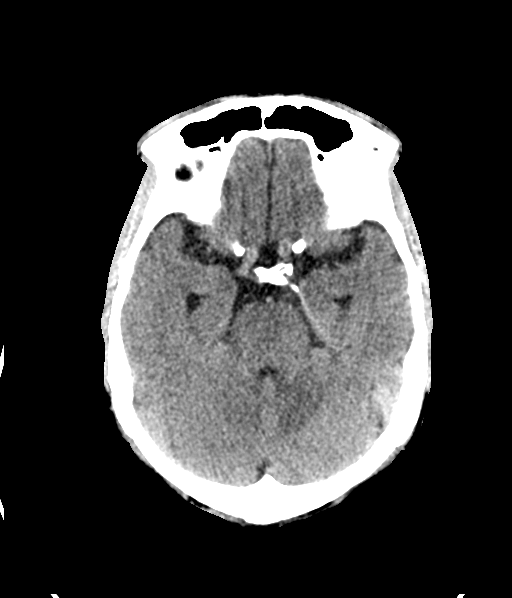
[im 29/66  brain]
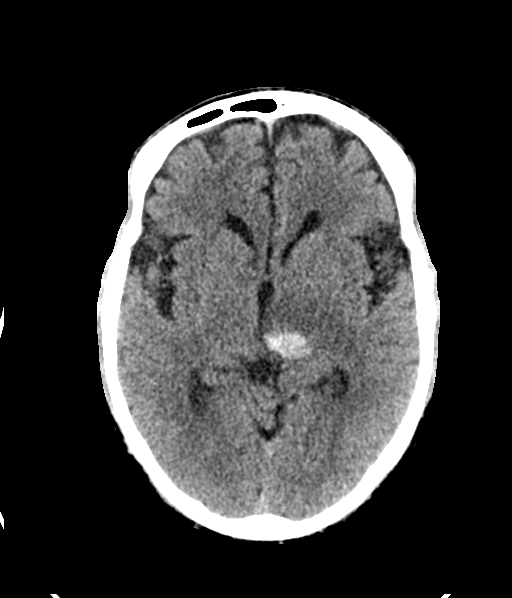
[im 37/66  brain]
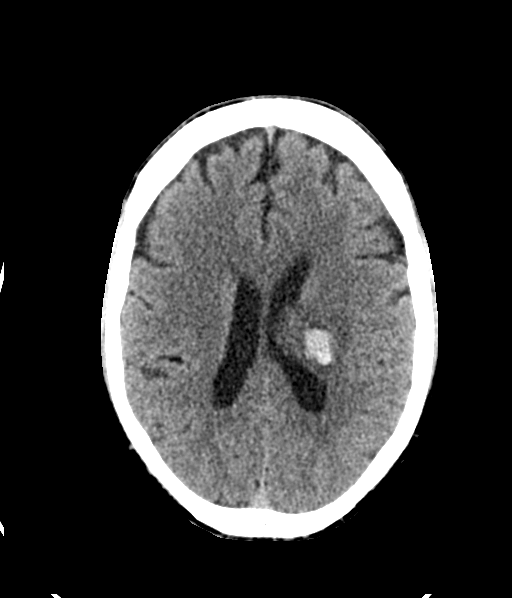
[im 37/66  bone]
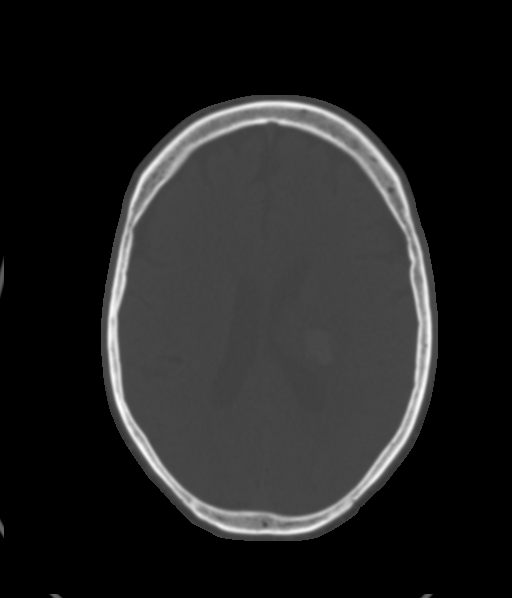
[im 44/66  brain]
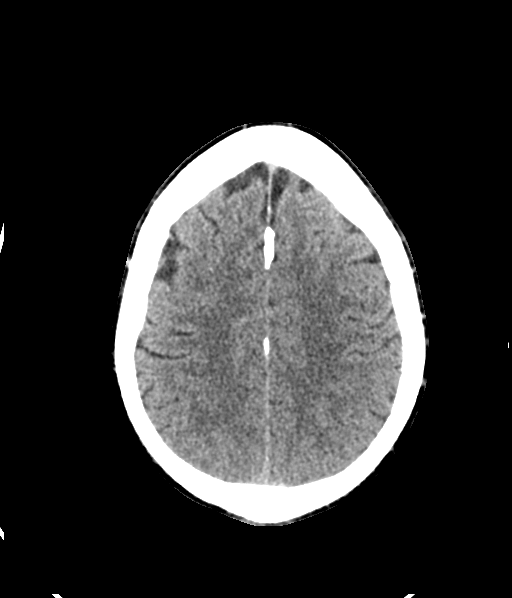
[im 51/66  brain]
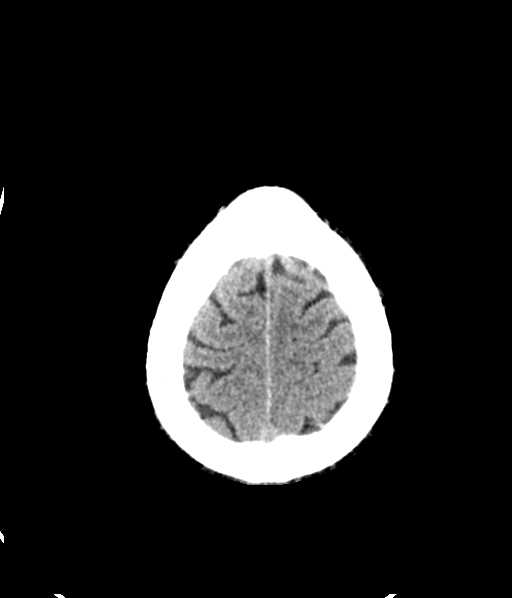
[im 58/66  brain]
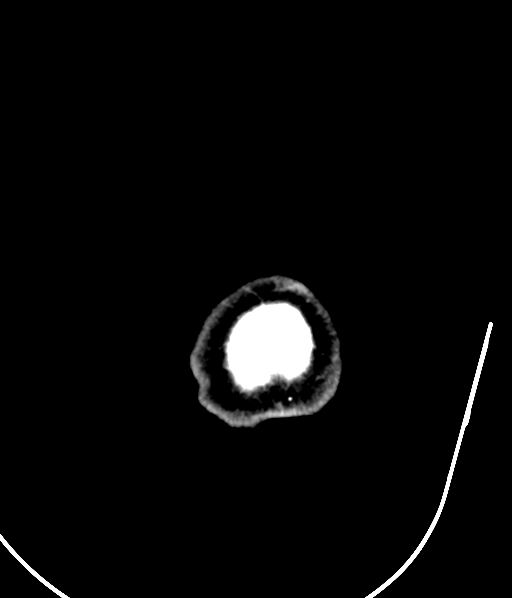

[13 of 47 positions shown; findings below may reference images not displayed]

FINDINGS: Brain: Hyperdense intra-axial hemorrhage centered at the left
thalamus encompasses 23 x 29 by 35 mm (AP by transverse by CC) for
an estimated intra-axial blood volume of 12 mL, versus 10 mL at
presentation. Mildly increased regional edema, but stable mild
regional mass effect. No intraventricular or extra-axial extension.

Superimposed chronic encephalomalacia in the left basal ganglia.
Stable gray-white matter differentiation outside of the thalamus. No
new cortically based infarct. No ventriculomegaly. Normal basilar
cisterns.

Vascular: No suspicious intracranial vascular hyperdensity.

Skull: No acute osseous abnormality identified.

Sinuses/Orbits: Visualized paranasal sinuses and mastoids are stable
and well aerated.

Other: No acute orbit or scalp soft tissue finding.
IMPRESSION: 1. Essentially stable left thalamic hemorrhage since presentation,
estimated blood volume 10-12 mL. Mildly increased regional edema but
stable mild regional mass effect. No extra-axial extension or other
complicating features.
2. No new intracranial abnormality.

## 2024-01-29 ENCOUNTER — Other Ambulatory Visit (HOSPITAL_COMMUNITY): Payer: Self-pay
